# Patient Record
Sex: Male | Born: 1970 | Race: White | Hispanic: No | Marital: Married | Smoking: Former smoker
Health system: Southern US, Academic
[De-identification: ages and names within clinical notes are randomized; demographics above are authoritative.]

## PROBLEM LIST (undated history)

## (undated) DIAGNOSIS — G43909 Migraine, unspecified, not intractable, without status migrainosus: Secondary | ICD-10-CM

## (undated) DIAGNOSIS — R112 Nausea with vomiting, unspecified: Secondary | ICD-10-CM

## (undated) DIAGNOSIS — K839 Disease of biliary tract, unspecified: Secondary | ICD-10-CM

## (undated) DIAGNOSIS — K219 Gastro-esophageal reflux disease without esophagitis: Secondary | ICD-10-CM

## (undated) DIAGNOSIS — Z9889 Other specified postprocedural states: Secondary | ICD-10-CM

## (undated) DIAGNOSIS — K469 Unspecified abdominal hernia without obstruction or gangrene: Secondary | ICD-10-CM

## (undated) DIAGNOSIS — F419 Anxiety disorder, unspecified: Secondary | ICD-10-CM

## (undated) HISTORY — DX: Migraine, unspecified, not intractable, without status migrainosus: G43.909

## (undated) HISTORY — DX: Gastro-esophageal reflux disease without esophagitis: K21.9

## (undated) NOTE — Progress Notes (Signed)
 Formatting of this note might be different from the original.  Assessment & Plan   1. Closed nondisplaced fracture of proximal phalanx of right middle finger, initial encounter (Primary)  2. 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  Assessment of injury: closed nondisplaced fx of proximal phalanx of right middle finger  To help this patient improve, their care plan will be: referral to ortho  Work Status: May return to full duty with no restrictions on 10/06/2023.  Work Restrictions: No restrictions.  Follow Up: Follow up with specialist - referral placed during visit.  Patient understands that they can return here any time or be seen in the Emergency Department if the injury/symptoms worsen in the mean time.    Subjective   Troy Wallace is a 9 y.o. (DOB [birth year]) male.     HPI  WORKERS' COMP  Chief Complaint:  pain in right 3rd finger    Essential Information:  Date of Injury: 08/29/2023  Mechanism of Injury: jammed finger into a door  Injured area fully functional prior to injury/no prior injuries?  Yes   Seen elsewhere for this injury prior to evaluation at [facility]? No  Other Pertinent History:    Pt states that the first 2 days after injury finger was swollen and bruised. Has been slowly improving. Reports he only has pain w/ bending. Was advised by employer to get checked. Able to do his job normally.     Review of Systems:  All other systems are reviewed and are negative    Objective     Physical Exam:  BP 104/67 (BP Location: Right arm, Patient Position: Sitting, BP Cuff Size: Large adult)   Pulse 77   Temp 36.8 C (98.2 F) (Oral)   Resp 18   Ht 1.88 m (6' 2)   Wt 75.3 kg (166 lb)   SpO2 98%   BMI 21.31 kg/m   Vital signs reviewed.  General: Alert, no acute distress  Resp: No signs of respiratory distress.  Musculoskeletal: no overlying skin changes  such as erythema, ecchymosis, abrasions, lacerations. Nontender. Mildly limited ROM of PIP joint. Mild edema around PIP joint noted.   Neuro: Alert, grossly oriented. No obvious abnormalities. No signs of motor weakness or ataxia.    3 xray views of R 3rd finger reviewed - old/healing fx of distal aspect of proximal phalanx of R 3rd finger.     Electronically signed by Damien Comer Conine, PA-C at 10/05/2023  4:39 PM EDT

---

## 1995-07-29 ENCOUNTER — Emergency Department (HOSPITAL_COMMUNITY): Payer: Self-pay

## 2014-07-21 HISTORY — PX: HX HERNIA REPAIR: SHX51

## 2020-05-09 ENCOUNTER — Other Ambulatory Visit: Payer: Self-pay

## 2020-05-09 ENCOUNTER — Ambulatory Visit: Payer: PPO | Attending: FAMILY PRACTICE

## 2020-05-09 DIAGNOSIS — F458 Other somatoform disorders: Secondary | ICD-10-CM | POA: Insufficient documentation

## 2020-05-09 DIAGNOSIS — F419 Anxiety disorder, unspecified: Secondary | ICD-10-CM | POA: Insufficient documentation

## 2020-05-09 DIAGNOSIS — N400 Enlarged prostate without lower urinary tract symptoms: Secondary | ICD-10-CM | POA: Insufficient documentation

## 2020-05-09 DIAGNOSIS — F341 Dysthymic disorder: Secondary | ICD-10-CM | POA: Insufficient documentation

## 2020-05-09 LAB — CBC WITH DIFF
BASOPHIL #: <0.1 10*3/uL (ref <=0.20)
BASOPHIL %: 1 %
EOSINOPHIL #: 0.17 10*3/uL (ref <=0.50)
EOSINOPHIL %: 3 %
HCT: 48.1 % (ref 38.9–52.0)
HGB: 15.8 g/dL (ref 13.4–17.5)
IMMATURE GRANULOCYTE #: <0.1 10*3/uL (ref <0.10)
IMMATURE GRANULOCYTE %: 0 % (ref 0–1)
LYMPHOCYTE #: 1.49 10*3/uL (ref 1.00–4.80)
LYMPHOCYTE %: 30 %
MCH: 31.2 pg (ref 26.0–32.0)
MCHC: 32.8 g/dL (ref 31.0–35.5)
MCV: 95.1 fL (ref 78.0–100.0)
MONOCYTE #: 0.53 10*3/uL (ref 0.20–1.10)
MONOCYTE %: 11 %
MPV: 10.3 fL (ref 8.7–12.5)
NEUTROPHIL #: 2.71 10*3/uL (ref 1.50–7.70)
NEUTROPHIL %: 55 %
PLATELETS: 252 10*3/uL (ref 150–400)
RBC: 5.06 10*6/uL (ref 4.50–6.10)
RDW-CV: 13.7 % (ref 11.5–15.5)
WBC: 5 10*3/uL (ref 3.7–11.0)

## 2020-05-09 LAB — COMPREHENSIVE METABOLIC PNL, FASTING
ALBUMIN/GLOBULIN RATIO: 1.7 (ref >=1.0)
ALBUMIN: 4.3 g/dL (ref 3.5–5.2)
ALKALINE PHOSPHATASE: 60 U/L (ref 41–133)
ALT (SGPT): 27 U/L (ref 0–55)
ANION GAP: 5 mmol/L — ABNORMAL LOW (ref 6–15)
AST (SGOT): 24 U/L (ref 5–34)
BILIRUBIN TOTAL: 0.6 mg/dL (ref 0.2–1.2)
BUN: 15 mg/dL (ref 7–21)
CALCIUM: 8.8 mg/dL (ref 8.0–10.6)
CHLORIDE: 103 mmol/L (ref 98–107)
CO2 TOTAL: 32 mmol/L (ref 21–32)
CREATININE: 1.27 mg/dL (ref 0.80–1.60)
ESTIMATED GFR: >60 mL/min/{1.73_m2}
GLUCOSE: 90 mg/dL (ref 70–100)
POTASSIUM: 4 mmol/L (ref 3.3–5.1)
PROTEIN TOTAL: 6.9 g/dL (ref 6.4–8.3)
SODIUM: 140 mmol/L (ref 136–146)

## 2020-05-09 LAB — LIPID PANEL
CHOL/HDL RATIO: 4.8
CHOLESTEROL: 182 mg/dL (ref 75–200)
HDL CHOL: 38 mg/dL (ref 27–58)
LDL CALC: 90 mg/dL (ref 0–99)
TRIGLYCERIDES: 269 mg/dL — ABNORMAL HIGH (ref 28–153)
VLDL CALC: 54 mg/dL — ABNORMAL HIGH (ref 0–50)

## 2020-05-09 LAB — VITAMIN B12: VITAMIN B 12: 1169 pg/mL — ABNORMAL HIGH (ref 180–914)

## 2020-05-09 LAB — HEPATIC FUNCTION PANEL
ALBUMIN/GLOBULIN RATIO: 1.7 (ref >=1.0)
ALBUMIN: 4.3 g/dL (ref 3.5–5.2)
ALKALINE PHOSPHATASE: 60 U/L (ref 41–133)
ALT (SGPT): 27 U/L (ref 0–55)
AST (SGOT): 24 U/L (ref 5–34)
BILIRUBIN DIRECT: 0.1 mg/dL (ref 0.0–0.5)
BILIRUBIN TOTAL: 0.6 mg/dL (ref 0.2–1.2)
PROTEIN TOTAL: 6.9 g/dL (ref 6.4–8.3)

## 2020-05-09 LAB — THYROXINE, FREE (FREE T4): THYROXINE (T4), FREE: 0.51 ng/dL — ABNORMAL LOW (ref 0.61–1.12)

## 2020-05-09 LAB — THYROID STIMULATING HORMONE (SENSITIVE TSH): TSH: 11.659 u[IU]/mL — ABNORMAL HIGH (ref 0.200–5.000)

## 2020-05-10 LAB — PSA, DIAGNOSTIC: PSA, TOTAL: 0.56 ng/mL (ref <=4.00)

## 2020-05-10 LAB — VITAMIN D 25 TOTAL: VITAMIN D,25-OH,TOTAL,IA: 35 ng/mL (ref 30–100)

## 2020-06-20 ENCOUNTER — Other Ambulatory Visit: Payer: Self-pay

## 2020-06-20 ENCOUNTER — Encounter (INDEPENDENT_AMBULATORY_CARE_PROVIDER_SITE_OTHER): Payer: Self-pay | Admitting: Surgery

## 2020-06-20 ENCOUNTER — Ambulatory Visit: Payer: PPO | Attending: Surgery | Admitting: Surgery

## 2020-06-20 VITALS — BP 118/80 | HR 87 | Ht 74.0 in | Wt 167.0 lb

## 2020-06-20 DIAGNOSIS — R197 Diarrhea, unspecified: Secondary | ICD-10-CM

## 2020-06-20 DIAGNOSIS — R110 Nausea: Secondary | ICD-10-CM

## 2020-06-20 DIAGNOSIS — R1011 Right upper quadrant pain: Secondary | ICD-10-CM

## 2020-06-20 NOTE — H&P (Signed)
Patient:   Troy Wallace                      Age:   50 y.o.     Sex:  male     DOB:  [birth year]  Associated Diagnoses:  Abdominal pain change in bowel habits  Author:   Newman Pies MD, Apolinar Junes      Chief Complaint   I have been having some abdominal pain and change in bowel habit  HISTORY OF PRESENT ILLNESS:   Troy Wallace is a pleasant 50 y.o. male who was referred to my office by Miguel Aschoff, DO for consultation regarding abdominal pain and change in bowel habits patient has not had any blood but has had persistent nausea abdominal pain and cramping with diarrhea.      Review of Systems   Constitutional:  No fever, No chills, No sweats, No weakness, No fatigue, No decreased activity.    Eye:  No recent visual problem, No icterus, No double vision, No visual disturbances.    Ear/Nose/Mouth/Throat:  No decreased hearing, No ear pain, No nasal congestion, No sore throat.    Respiratory:  No shortness of breath, No sputum production, No hemoptysis, No wheezing, No cyanosis, No apnea.    Cardiovascular:  No chest pain, No palpitations, No bradycardia, No tachycardia.    Gastrointestinal:  No nausea, No vomiting, No diarrhea, No constipation, No heartburn, No abdominal pain, No hematemesis.    Genitourinary:  No dysuria, No hematuria, No change in urine stream, No urethral discharge, No lesions.    Hematology/Lymphatics:  No bruising tendency, No bleeding tendency, No swollen lymph glands.    Endocrine:  No excessive thirst, No polyuria, No cold intolerance, No heat intolerance, No excessive hunger.    Immunologic:  Negative.    Musculoskeletal:  Negative.    Integumentary:  No rash, No pruritus, No petechiae.    Neurologic:  Alert and oriented X4, No confusion, No numbness, No tingling, No headache.    Psychiatric:  Negative.    All other systems reviewed and negative      Health Status   Allergies:   No Known Allergies    Medications:    Outpatient Encounter Medications as of 06/20/2020    Medication Sig Dispense Refill    buPROPion (WELLBUTRIN XL) 300 mg extended release 24 hr tablet Take 300 mg by mouth Once a day      lamoTRIgine (LAMICTAL) 25 mg Oral Tablet Take 25 mg by mouth Twice daily      nortriptyline (PAMELOR) 25 mg Oral Capsule TAKE ONE CAPSULE BY MOUTH IN THE EVENING      QUEtiapine (SEROQUEL) 100 mg Oral Tablet TAKE 1/2 TO 1 TABLET BY MOUTH AT BEDTIME AS NEEDED for insomnia      QUEtiapine (SEROQUEL) 200 mg Oral Tablet Take 200 mg by mouth Every night      rizatriptan (MAXALT) 10 mg Oral Tablet take 1 tablet by mouth as directed as needed       No facility-administered encounter medications on file as of 06/20/2020.       Problem list:    There is no problem list on file for this patient.        Histories   Past Medical History:    Past Medical History:   Diagnosis Date    Esophageal reflux     Migraines            Family History:    Family Medical  History:     Problem Relation (Age of Onset)    Colon Polyps Father, Brother    Diabetes Father    Heart Disease Father            Past Surgical history:    Past Surgical History:   Procedure Laterality Date    HX HERNIA REPAIR             Social History:       Social History     Socioeconomic History    Marital status: Married   Tobacco Use    Smoking status: Never Smoker    Smokeless tobacco: Current Market researcher Use: Never used   Substance and Sexual Activity    Alcohol use: Never    Drug use: Never           Physical Examination   VS/Measurements   BP 118/80    Pulse 87    Ht 1.88 m (6\' 2" )    Wt 75.8 kg (167 lb)    BMI 21.44 kg/m         General:  Alert and oriented, No acute distress.    Eye:  Vision unchanged.    HENT:  Normocephalic.    Neck:  Supple, Non-tender, No jugular venous distention, No lymphadenopathy, No thyromegaly.    Respiratory:  Lungs are clear to auscultation, Breath sounds are equal, Symmetrical chest wall expansion, No chest wall tenderness.    Cardiovascular:  Normal rate, Regular  rhythm, No murmur, No gallop, Good pulses equal in all extremities, Normal peripheral perfusion.    Gastrointestinal:  Soft, Non-tender, Non-distended, No organomegaly, Bowel sounds.   Genitourinary:  No costovertebral angle tenderness.    Lymphatics:  No lymphadenopathy neck, axilla, groin.    Musculoskeletal:  Normal range of motion.    Integumentary:  Warm, Dry.    Neurologic:  Alert, Oriented, Normal sensory, Normal motor function.    Psychiatric:  Cooperative.       Labs and Imaging:  Pending    Assessment:  Agree with need for upper and lower endoscopy as well as gallbladder ultrasound and HIDA scan       Plan:  We will plan upper and lower endoscopy risks include bleeding infection perforation failure to diagnose he understands wishes to proceed will also order gallbladder ultrasound and HIDA scan

## 2020-06-20 NOTE — Nursing Note (Signed)
Pt Presents here today for EVAL FOR EGD/ COLONOSCOPY

## 2020-06-27 ENCOUNTER — Other Ambulatory Visit: Payer: Self-pay

## 2020-06-27 ENCOUNTER — Ambulatory Visit
Admission: RE | Admit: 2020-06-27 | Discharge: 2020-06-27 | Disposition: A | Discharge status: Home / Self Care | Payer: PPO | Source: Ambulatory Visit | Attending: Surgery | Admitting: Surgery

## 2020-06-27 DIAGNOSIS — R1011 Right upper quadrant pain: Secondary | ICD-10-CM

## 2020-07-23 ENCOUNTER — Telehealth (INDEPENDENT_AMBULATORY_CARE_PROVIDER_SITE_OTHER): Payer: Self-pay | Admitting: Surgery

## 2020-07-23 NOTE — Telephone Encounter (Signed)
Health Plan Insurance  Spoke to [facility] @ 10:18am    NO Authorization is required for surgery scheduled Aug 21, 2020    Procedure codes:  43235 and 23343    Call Ref # [id number]

## 2020-08-01 ENCOUNTER — Ambulatory Visit
Admission: RE | Admit: 2020-08-01 | Discharge: 2020-08-01 | Disposition: A | Discharge status: Home / Self Care | Payer: PPO | Source: Ambulatory Visit | Attending: Surgery | Admitting: Surgery

## 2020-08-01 ENCOUNTER — Other Ambulatory Visit: Payer: Self-pay

## 2020-08-01 DIAGNOSIS — R1011 Right upper quadrant pain: Secondary | ICD-10-CM | POA: Insufficient documentation

## 2020-08-01 MED ORDER — SINCALIDE 5 MCG SOLUTION FOR INJECTION
0.0200 ug/kg | INTRAMUSCULAR | Status: DC
Start: 2020-08-01 — End: 2020-08-02

## 2020-08-04 ENCOUNTER — Encounter (HOSPITAL_COMMUNITY): Payer: Self-pay | Admitting: Radiology

## 2020-08-08 ENCOUNTER — Encounter (HOSPITAL_COMMUNITY): Payer: Self-pay | Admitting: Surgery

## 2020-08-08 ENCOUNTER — Ambulatory Visit: Payer: PPO | Attending: INTERNAL MEDICINE

## 2020-08-08 ENCOUNTER — Other Ambulatory Visit: Payer: Self-pay

## 2020-08-08 ENCOUNTER — Telehealth (INDEPENDENT_AMBULATORY_CARE_PROVIDER_SITE_OTHER): Payer: Self-pay | Admitting: Physician Assistant

## 2020-08-08 DIAGNOSIS — Z136 Encounter for screening for cardiovascular disorders: Secondary | ICD-10-CM

## 2020-08-08 DIAGNOSIS — E039 Hypothyroidism, unspecified: Secondary | ICD-10-CM | POA: Insufficient documentation

## 2020-08-08 DIAGNOSIS — I519 Heart disease, unspecified: Secondary | ICD-10-CM | POA: Insufficient documentation

## 2020-08-08 LAB — THYROXINE, FREE (FREE T4): THYROXINE (T4), FREE: 0.48 ng/dL — ABNORMAL LOW (ref 0.61–1.12)

## 2020-08-08 LAB — LIPID PANEL
CHOL/HDL RATIO: 5.7
CHOLESTEROL: 212 mg/dL — ABNORMAL HIGH (ref 75–200)
HDL CHOL: 37 mg/dL (ref 27–58)
LDL CALC: 124 mg/dL — ABNORMAL HIGH (ref 0–99)
TRIGLYCERIDES: 257 mg/dL — ABNORMAL HIGH (ref 28–153)
VLDL CALC: 51 mg/dL — ABNORMAL HIGH (ref 0–50)

## 2020-08-08 LAB — THYROID STIMULATING HORMONE (SENSITIVE TSH): TSH: 8.984 u[IU]/mL — ABNORMAL HIGH (ref 0.450–5.330)

## 2020-08-08 MED ORDER — SUPREP BOWEL PREP KIT 17.5 GRAM-3.13 GRAM-1.6 GRAM ORAL SOLUTION
ORAL | 0 refills | Status: DC
Start: 2020-08-08 — End: 2020-09-10

## 2020-08-08 NOTE — Telephone Encounter (Signed)
Patient request suprep bowel prep kit. Sent to pharmacy [facility])

## 2020-08-14 ENCOUNTER — Ambulatory Visit (HOSPITAL_BASED_OUTPATIENT_CLINIC_OR_DEPARTMENT_OTHER): Payer: PPO

## 2020-08-14 ENCOUNTER — Other Ambulatory Visit: Payer: Self-pay

## 2020-08-14 ENCOUNTER — Ambulatory Visit
Admission: RE | Admit: 2020-08-14 | Discharge: 2020-08-14 | Disposition: A | Discharge status: Home / Self Care | Payer: PPO | Source: Ambulatory Visit | Attending: Surgery | Admitting: Surgery

## 2020-08-14 ENCOUNTER — Encounter (HOSPITAL_COMMUNITY): Payer: Self-pay | Admitting: Surgery

## 2020-08-14 ENCOUNTER — Encounter (HOSPITAL_COMMUNITY)
Admission: RE | Disposition: A | Discharge status: Home / Self Care | Payer: Self-pay | Source: Ambulatory Visit | Attending: Surgery

## 2020-08-14 ENCOUNTER — Ambulatory Visit (HOSPITAL_COMMUNITY): Payer: PPO

## 2020-08-14 DIAGNOSIS — R1084 Generalized abdominal pain: Secondary | ICD-10-CM

## 2020-08-14 DIAGNOSIS — R1013 Epigastric pain: Secondary | ICD-10-CM

## 2020-08-14 HISTORY — DX: Unspecified abdominal hernia without obstruction or gangrene: K46.9

## 2020-08-14 HISTORY — DX: Nausea with vomiting, unspecified: R11.2

## 2020-08-14 HISTORY — DX: Anxiety disorder, unspecified: F41.9

## 2020-08-14 HISTORY — DX: Other specified postprocedural states: Z98.890

## 2020-08-14 SURGERY — GASTROSCOPY WITH BIOPSY
Anesthesia: Monitor Anesthesia Care | Wound class: Clean Contaminated Wounds-The respiratory, GI, Genital, or urinary

## 2020-08-14 MED ORDER — PROPOFOL 10 MG/ML IV BOLUS
INJECTION | Freq: Once | INTRAVENOUS | Status: DC | PRN
Start: 2020-08-14 — End: 2020-08-14
  Administered 2020-08-14: 30 mg via INTRAVENOUS
  Administered 2020-08-14: 20 mg via INTRAVENOUS
  Administered 2020-08-14: 50 mg via INTRAVENOUS

## 2020-08-14 MED ORDER — ONDANSETRON HCL (PF) 4 MG/2 ML INJECTION SOLUTION
Freq: Once | INTRAMUSCULAR | Status: DC | PRN
Start: 2020-08-14 — End: 2020-08-14
  Administered 2020-08-14: 4 mg via INTRAVENOUS

## 2020-08-14 MED ORDER — LACTATED RINGERS INTRAVENOUS SOLUTION
INTRAVENOUS | Status: DC
Start: 2020-08-14 — End: 2020-08-14

## 2020-08-14 MED ORDER — MIDAZOLAM (PF) 1 MG/ML INJECTION SOLUTION
Freq: Once | INTRAMUSCULAR | Status: DC | PRN
Start: 2020-08-14 — End: 2020-08-14
  Administered 2020-08-14: 2 mg via INTRAVENOUS

## 2020-08-14 MED ORDER — FENTANYL (PF) 50 MCG/ML INJECTION SOLUTION
Freq: Once | INTRAMUSCULAR | Status: DC | PRN
Start: 2020-08-14 — End: 2020-08-14
  Administered 2020-08-14 (×2): 100 ug via INTRAVENOUS

## 2020-08-14 MED ORDER — LIDOCAINE (PF) 100 MG/5 ML (2 %) INTRAVENOUS SYRINGE
INJECTION | Freq: Once | INTRAVENOUS | Status: DC | PRN
Start: 2020-08-14 — End: 2020-08-14
  Administered 2020-08-14: 50 mg via INTRAVENOUS

## 2020-08-14 MED ORDER — SIMETHICONE 40 MG/0.6 ML ORAL DROPS,SUSPENSION
Freq: Once | ORAL | Status: DC | PRN
Start: 2020-08-14 — End: 2020-08-14
  Administered 2020-08-14: 40 mg

## 2020-08-14 SURGICAL SUPPLY — 30 items
BLOCK BITE OD60 ENDO BITE BLOCK 60 FRENCH W/DENTAL (ENDOSCOPIC SUPPLIES) ×1 IMPLANT
BLOCK BITE OD60 ENDO BITE BLOCK 60 FRENCH W/DENTAL (INSTRUMENTS ENDOMECHANICAL) ×1
CANISTER 2000 SUCT BLUE TOP FLUSHABLE (SUPP) ×4 IMPLANT
CLIP LGT RSL 360 ULTRA 235CM B RD ROT CONTROL KNOB 17MM OPN (ENDOSCOPIC SUPPLIES) IMPLANT
CLIP LGT RSL 360 ULTRA 235CM B RD ROT CONTROL KNOB 17MM OPN (INSTRUMENTS ENDOMECHANICAL)
CLOTEST 60480 BILL LAB/DEL DHC KIT MICROBIOLOGY TEST CLOTEST (SUPP) IMPLANT
ELECTRODE GROUNDING PAD 9165 PAD ELECTROSURGICAL GROUNDING (SUPP) IMPLANT
FORCEP BIOPSY EXL COLD 513412 2MM RJ 4 2.8MM STD CPC STRL (MED) IMPLANT
FORCEP BIOPSY HOT 515033 40/BX 0CM 2.8MM 2.2MM RJ 4 CUP DISP (SUPP) ×2 IMPLANT
GLOVE EXAM LG NITRIL PF LAVEN GLOVE EXAM LG NITRIL PF LAVEN (GLOVES AND ACCESSORIES) ×2 IMPLANT
KIT 00719701 COMPLIANCE KIT 00719701 COMPLIANCE (SUPP) ×4 IMPLANT
LIGATOR 100225 HEMORRHOID LIGATOR 100225 HEMORRHOID (SURG) IMPLANT
NEEDLE 00711807 INJ ARTICULATO NEEDLE 00711807 INJ ARTICULAT (SUPP) IMPLANT
PROBE 6007 GOLD 7 PROBE 6007 GOLD 7 (SUPP) IMPLANT
PROBE 6010 GOLD 10 PROBE 6010 GOLD 10 (SUPP) IMPLANT
PROBE 60220 GOLD INACT 11/21 FOR WORKDAY (SUPP) IMPLANT
PROBE M0056007 GOLD PROBE M0056007 GOLD (SUPP) IMPLANT
SENSATION SNARE 626940 INACT 11/21 FOR WORKDAY (SUPP) IMPLANT
SNARE 6237 CAPTIV CRESCENT MED SNARE 6237 CAPTIV CRESCENT ME (SUPP) IMPLANT
SNARE 6247 X-SML CAPITIFLEX SNARE 6247 X-SML CAPITIFLEX (SUPP) IMPLANT
SNARE CS3-11023230 COLD SNARE CS3-11023230 COLD (SUPP) IMPLANT
SNARE KIT 25 X 120 ITINOL CATH PREFORM LOOP (SUPP) IMPLANT
SPEEDBAND 422502 SUPERVIE SPEEDBAND 422502 SUPERVIE (ENDOSCOPIC SUPPLIES) IMPLANT
SPEEDBAND 422502 SUPERVIE SPEEDBAND 422502 SUPERVIE (INSTRUMENTS ENDOMECHANICAL)
SYRINGE 309654 SLIP TIP 60CC YRG DEHP-FR PVC FREE MED DISP (SUPP) ×2 IMPLANT
SYRINGE GIS45 TATTOO MARKER ENDOSCOPIC SPOT EX PREF (SUPP) IMPLANT
TRAP POLYP QUAD CHAMBER TRAP POLYP QUAD CHAMBER (SURG) IMPLANT
TUBE M00568211 FEEDING PEG 20F TUBE M00568211 FEEDING PEG 20 (TUBE/TUBING & SUCTION SUPPLIES) IMPLANT
TUBING SUCTION CONNECT10FT TUBING SUCTION CONNECT 10ft (SUPP) ×4 IMPLANT
UNDERPADS 23X36 DRI-SORB YPROP MDRT ABS NWVN LF DISP (MED) ×4 IMPLANT

## 2020-08-14 NOTE — Discharge Instructions (Signed)
SURGICAL DISCHARGE INSTRUCTIONS     Dr. Ball, Brandon, MD  performed your EGD, COLONOSCOPY today at the [facility]    [facility] ([facility])  Monday through Friday 8:00 am to 4:00 pm  [phone]      PLEASE SEE WRITTEN HANDOUTS AS DISCUSSED BY YOUR NURSE:      SIGNS AND SYMPTOMS OF A WOUND / INCISION INFECTION   Be sure to watch for the following:   Increase in redness or red streaks near or around the wound or incision.   Increase in pain that is intense or severe and cannot be relieved by the pain medication that your doctor has given you.   Increase in swelling that cannot be relieved by elevation of a body part, or by applying ice, if permitted.   Increase in drainage, or if yellow / green in color and smells bad. This could be on a dressing or a cast.   Increase in fever for longer than 24 hours, or an increase that is higher than 101 degrees Fahrenheit (normal body temperature is 98 degrees Fahrenheit). The incision may feel warm to the touch.    **CALL YOUR DOCTOR IF ONE OR MORE OF THESE SIGNS / SYMPTOMS SHOULD OCCUR.    ANESTHESIA INFORMATION   ANESTHESIA -- ADULT PATIENTS:  You have received intravenous sedation / general anesthesia, and you may feel drowsy and light-headed for several hours. You may even experience some forgetfulness of the procedure. DO NOT DRIVE A MOTOR VEHICLE or perform any activity requiring complete alertness or coordination until you feel fully awake in about 24-48 hours. Do not drink alcoholic beverages for at least 24 hours. Do not stay alone, you must have a responsible adult available to be with you. You may also experience a dry mouth or nausea for 24 hours. This is a normal side effect and will disappear as the effects of the medication wear off.    REMEMBER   If you experience any difficulty breathing, chest pain, bleeding that you feel is excessive, persistent nausea or vomiting or for any other  concerns:  Call your physician Dr.  Ball, Brandon, MD or [phone] You may also ask to have the general doctor on call paged. They are available to you 24 hours a day.    SPECIAL INSTRUCTIONS / COMMENTS       FOLLOW-UP APPOINTMENTS   Please call your surgeon's office at the number listed about  to schedule a date / time of return.

## 2020-08-14 NOTE — Anesthesia Postprocedure Evaluation (Signed)
Anesthesia Post Op Evaluation    Patient: Troy Wallace  Procedure(s):  EGD W/ BIOPSY  COLONOSCOPY    Last Vitals:Temperature: 36.1 C (97 F) (08/14/20 0911)  Heart Rate: 78 (08/14/20 0911)  BP (Non-Invasive): 118/66 (08/14/20 0932)  Respiratory Rate: 16 (08/14/20 0932)  SpO2: 98 % (08/14/20 0932)    No complications documented.    Patient is sufficiently recovered from the effects of anesthesia to participate in the evaluation and has returned to their pre-procedure level.  Patient location during evaluation: PACU       Patient participation: complete - patient participated  Level of consciousness: awake and alert and responsive to verbal stimuli    Pain score: 0  Pain management: adequate  Airway patency: patent    Anesthetic complications: no  Cardiovascular status: acceptable  Respiratory status: acceptable  Hydration status: acceptable  Patient post-procedure temperature: Pt Normothermic   PONV Status: Absent

## 2020-08-14 NOTE — Anesthesia Transfer of Care (Signed)
ANESTHESIA TRANSFER OF CARE   Troy Wallace is a 50 y.o. ,male, Weight: 72.6 kg (160 lb)   had Procedure(s):  EGD W/ BIOPSY  COLONOSCOPY  performed  08/14/20   Primary Service: Lizbeth Bark, MD    Past Medical History:   Diagnosis Date   . Abdominal hernia    . Anxiety    . Esophageal reflux    . Migraines    . PONV (postoperative nausea and vomiting)       Allergy History as of 08/14/20      No Known Allergies              I completed my transfer of care / handoff to the receiving personnel during which we discussed:  Access, Airway, All key/critical aspects of case discussed, Analgesia, Antibiotics, Expectation of post procedure, Fluids/Product, Gave opportunity for questions and acknowledgement of understanding, Labs and PMHx                                                                      Last OR Temp: Temperature: 36.1 C (97 F)  ABG:  POTASSIUM   Date Value Ref Range Status   05/09/2020 4.0 3.3 - 5.1 mmol/L Final     CALCIUM   Date Value Ref Range Status   05/09/2020 8.8 8.0 - 10.6 mg/dL Final     Airway:* No LDAs found *  Blood pressure 122/71, pulse 78, temperature 36.1 C (97 F), resp. rate 12, height 1.88 m (6\' 2" ), weight 72.6 kg (160 lb), SpO2 97 %.

## 2020-08-14 NOTE — Anesthesia Preprocedure Evaluation (Signed)
ANESTHESIA PRE-OP EVALUATION  Planned Procedure: EGD (N/A )  COLONOSCOPY (N/A )  Review of Systems    PONV       patient summary reviewed  nursing notes reviewed        Pulmonary  negative pulmonary ROS,    Cardiovascular  negative cardio ROS,   ECG reviewed ,No peripheral edema,        GI/Hepatic/Renal    GERD     Endo/Other   neg endo/other ROS,        Neuro/Psych/MS    headaches and anxiety     Cancer  negative hematology/oncology ROS,                    Physical Assessment      Airway       Mallampati: I    TM distance: >3 FB    Neck ROM: full  Mouth Opening: good.  Facial hair  Beard  No endotracheal tube present  No Tracheostomy present    Dental       Dentition intact             Pulmonary    Breath sounds clear to auscultation  (-) no rhonchi, no decreased breath sounds, no wheezes, no rales and no stridor     Cardiovascular    Rhythm: regular  Rate: Normal  (-) no friction rub, carotid bruit is not present, no peripheral edema and no murmur     Other findings            Plan  ASA 2     Planned anesthesia type: MAC                     Intravenous induction     Anesthesia issues/risks discussed are: Dental Injuries, PONV, Stroke and Blood Loss.  Anesthetic plan and risks discussed with patient.          Patient's NPO status is appropriate for Anesthesia.           (Pt will need Zofran for PONV.)

## 2020-08-14 NOTE — H&P (Signed)
H&P  Lizbeth Bark, MD (Physician) . [facility]  Expand AllCollapse All    Patient:   Troy Wallace                      Age:   50 y.o.     Sex:  male     DOB:  04/27/1970  Associated Diagnoses:  Abdominal pain change in bowel habits  Author:   Newman Pies MD, Apolinar Junes      Chief Complaint   I have been having some abdominal pain and change in bowel habit  HISTORY OF PRESENT ILLNESS:   Troy Wallace is a pleasant 50 y.o. male who was referred to my office by Miguel Aschoff, DO for consultation regarding abdominal pain and change in bowel habits patient has not had any blood but has had persistent nausea abdominal pain and cramping with diarrhea.      Review of Systems   Constitutional:  No fever, No chills, No sweats, No weakness, No fatigue, No decreased activity.    Eye:  No recent visual problem, No icterus, No double vision, No visual disturbances.    Ear/Nose/Mouth/Throat:  No decreased hearing, No ear pain, No nasal congestion, No sore throat.    Respiratory:  No shortness of breath, No sputum production, No hemoptysis, No wheezing, No cyanosis, No apnea.    Cardiovascular:  No chest pain, No palpitations, No bradycardia, No tachycardia.    Gastrointestinal:  No nausea, No vomiting, No diarrhea, No constipation, No heartburn, No abdominal pain, No hematemesis.    Genitourinary:  No dysuria, No hematuria, No change in urine stream, No urethral discharge, No lesions.    Hematology/Lymphatics:  No bruising tendency, No bleeding tendency, No swollen lymph glands.    Endocrine:  No excessive thirst, No polyuria, No cold intolerance, No heat intolerance, No excessive hunger.    Immunologic:  Negative.    Musculoskeletal:  Negative.    Integumentary:  No rash, No pruritus, No petechiae.    Neurologic:  Alert and oriented X4, No confusion, No numbness, No tingling, No headache.    Psychiatric:  Negative.    All other systems reviewed and negative      Health Status   Allergies:   No  Known Allergies    Medications:           Outpatient Encounter Medications as of 06/20/2020   Medication Sig Dispense Refill   . buPROPion (WELLBUTRIN XL) 300 mg extended release 24 hr tablet Take 300 mg by mouth Once a day     . lamoTRIgine (LAMICTAL) 25 mg Oral Tablet Take 25 mg by mouth Twice daily     . nortriptyline (PAMELOR) 25 mg Oral Capsule TAKE ONE CAPSULE BY MOUTH IN THE EVENING     . QUEtiapine (SEROQUEL) 100 mg Oral Tablet TAKE 1/2 TO 1 TABLET BY MOUTH AT BEDTIME AS NEEDED for insomnia     . QUEtiapine (SEROQUEL) 200 mg Oral Tablet Take 200 mg by mouth Every night     . rizatriptan (MAXALT) 10 mg Oral Tablet take 1 tablet by mouth as directed as needed       No facility-administered encounter medications on file as of 06/20/2020.       Problem list:    There is no problem list on file for this patient.        Histories   Past Medical History:         Past Medical History:   Diagnosis  Date   . Esophageal reflux    . Migraines            Family History:         Family Medical History:        Problem Relation (Age of Onset)    Colon Polyps Father, Brother    Diabetes Father    Heart Disease Father               Past Surgical history:          Past Surgical History:   Procedure Laterality Date   . HX HERNIA REPAIR             Social History:           Social History          Socioeconomic History   . Marital status: Married   Tobacco Use   . Smoking status: Never Smoker   . Smokeless tobacco: Current User   Vaping Use   . Vaping Use: Never used   Substance and Sexual Activity   . Alcohol use: Never   . Drug use: Never           Physical Examination   VS/Measurements   BP 118/80   Pulse 87   Ht 1.88 m (6\' 2" )   Wt 75.8 kg (167 lb)   BMI 21.44 kg/m         General:  Alert and oriented, No acute distress.    Eye:  Vision unchanged.    HENT:  Normocephalic.    Neck:  Supple, Non-tender, No jugular venous distention, No lymphadenopathy, No thyromegaly.    Respiratory:   Lungs are clear to auscultation, Breath sounds are equal, Symmetrical chest wall expansion, No chest wall tenderness.    Cardiovascular:  Normal rate, Regular rhythm, No murmur, No gallop, Good pulses equal in all extremities, Normal peripheral perfusion.    Gastrointestinal:  Soft, Non-tender, Non-distended, No organomegaly, Bowel sounds.   Genitourinary:  No costovertebral angle tenderness.    Lymphatics:  No lymphadenopathy neck, axilla, groin.    Musculoskeletal:  Normal range of motion.    Integumentary:  Warm, Dry.    Neurologic:  Alert, Oriented, Normal sensory, Normal motor function.    Psychiatric:  Cooperative.       Labs and Imaging:  Pending    Assessment:  Agree with need for upper and lower endoscopy as well as gallbladder ultrasound and HIDA scan       Plan:  We will plan upper and lower endoscopy risks include bleeding infection perforation failure to diagnose he understands wishes to proceed will also order gallbladder ultrasound and HIDA scan            Other Notes  All notes     Nursing Note from      Instructions     After Visit Summary (Automatic SnapShot taken 06/20/2020)        Additional Documentation    Vitals:  BP 118/80     Pulse 87     Ht 1.88 m (6\' 2" )     Wt 75.8 kg (167 lb)     BMI 21.44 kg/m     BSA 1.99 m

## 2020-08-15 LAB — HELICOBACTER PYLORI RAPID UREASE: H PYLORI PATIENT RESULT: NEGATIVE

## 2020-08-29 ENCOUNTER — Encounter (INDEPENDENT_AMBULATORY_CARE_PROVIDER_SITE_OTHER): Payer: Self-pay | Admitting: Physician Assistant

## 2020-08-29 ENCOUNTER — Other Ambulatory Visit: Payer: Self-pay

## 2020-08-29 ENCOUNTER — Ambulatory Visit: Payer: PPO | Attending: Physician Assistant | Admitting: Physician Assistant

## 2020-08-29 VITALS — BP 109/74 | HR 92 | Ht 74.0 in | Wt 170.0 lb

## 2020-08-29 DIAGNOSIS — K828 Other specified diseases of gallbladder: Secondary | ICD-10-CM

## 2020-08-29 NOTE — H&P (View-Only) (Signed)
[place] [facility]  Clinic History & Physical    Patient: Troy Wallace   D.O.B.: 1970-05-01  MRN# [id number]  Date of Service: 08/29/2020    Referring Provider: No ref. provider found    PCP: Nunzio Cory, DO    Chief Complaint:   Chief Complaint   Patient presents with   . Follow Up     FOLLOW UP ON COLONOSCOPY AND EGD 5/26         HPI  Troy Wallace is a 50 y.o. male who presents today for follow up after testing and after colonoscopy and EGD. He was initially seen by Dr. Diona Foley in April with cramping abdominal pain, nausea and change in bowel habits. He underwent RUQ [place], HIDA scan and upper and lower endoscopy. His endoscopic evaluation was relatively normal. RUQ without stones. HIDA scan with 5% EF. Patient still has ongoing nausea. He complains of epigastric abdominal pain. Bowel function has improved. He would like to proceed with cholecystectomy.     Allergies:  No Known Allergies    Current Outpatient Prescriptions  Outpatient Medications Marked as Taking for the 08/29/20 encounter (Office Visit) with Peggyann Juba, PA-C   Medication Sig   . atorvastatin (LIPITOR) 10 mg Oral Tablet Take 10 mg by mouth Every night   . buPROPion (WELLBUTRIN XL) 300 mg extended release 24 hr tablet Take 300 mg by mouth Once a day   . COCONUT OIL ORAL Take 1 Capsule by mouth Twice daily   . lamoTRIgine (LAMICTAL) 25 mg Oral Tablet Take 25 mg by mouth Twice daily 125 mg twice a day   . nortriptyline (PAMELOR) 25 mg Oral Capsule TAKE ONE CAPSULE BY MOUTH IN THE EVENING   . omega-3-DHA-EPA-fish oil 1,000 mg (120 mg-180 mg) Oral Capsule Take 1,000 mg by mouth Once a day   . pediatric multivitamins Oral Tablet, Chewable Chew 1 Tablet Once a day   . QUEtiapine (SEROQUEL) 100 mg Oral Tablet TAKE 1/2 TO 1 TABLET BY MOUTH AT BEDTIME AS NEEDED for insomnia   . QUEtiapine (SEROQUEL) 200 mg Oral Tablet Take 200 mg by mouth Every night   . rizatriptan (MAXALT) 10 mg Oral Tablet take 1 tablet by  mouth as directed as needed   . sodium,potassium,mag sulfates (SUPREP BOWEL PREP KIT) 17.5-3.13-1.6 gram Oral Recon Soln Pour 6oz bottle of SUPREP into container,add cool water to 16oz line.Mix.Drink ALL the liquid.Then drink 32oz of water over the next hour.        Past Medical History:    Past Medical History:   Diagnosis Date   . Abdominal hernia    . Anxiety    . Esophageal reflux    . Migraines    . PONV (postoperative nausea and vomiting)            Past Surgical History:    Past Surgical History:   Procedure Laterality Date   . HX HAND SURGERY     . HX HERNIA REPAIR Bilateral 07/2014    laparoscopic bilateral inguinal hernia repair with Dr. Diona Foley   . SUBDURAL HEMATOMA EVACUATION VIA CRANIOTOMY             Family History:  Family Medical History:     Problem Relation (Age of Onset)    Colon Polyps Father, Brother    Diabetes Father    Heart Disease Father            Social History:    Social History  Socioeconomic History   . Marital status: Married   Tobacco Use   . Smoking status: Never Smoker   . Smokeless tobacco: Current User   Vaping Use   . Vaping Use: Never used   Substance and Sexual Activity   . Alcohol use: Never   . Drug use: Never       ROS:  General: Denies fever, chills, or any changes in weight.  EENT: Denies blurry vision, tinnitus, nasal congestion, or trouble with swallowing.  Cardiovascular: Denies chest pain, pain on exertion, or palpitations.  Respiratory: Denies SOB, cough, or asthma/COPD/emphysema.  Gastrointestinal: +epigastic abdominal pain, +N,  Denies any change in bowel movements or blood in stool.  GU: Denies dysuria, change in urinary habits, or hematuria.  Musculoskeletal: Denies weakness or joint pain. Denies trouble moving extremities.  Neurological: Denies HA, dizziness, hx of stroke.  Hematologic: Denies hx of bleeding disorders or blood clots.  Endocrine: Denies heat/cold intolerance, flushing, or change in glove/shoe size.  Psychiatric: Denies changes in mood, anxiety,  or depression.    Objective:  BP 109/74   Pulse 92   Ht 1.88 m (_0 )   Wt 77.1 kg (170 lb)   BMI 21.83 kg/m       Physical Exam:  Constitutional:AAOx3, WDWN, NAD  HEENT: Normocephalic, atraumatic. PERRLA.   Neck: Trachea midline, supple  Cardiovascular: RR  Pulmonary: Lungs CTA bilaterally; No wheezes, rales, or rhonchi observed. Normal respiratory effort. No retractions.  Abdomen: Abdomen soft and supple; No tenderness. Non-distended; No masses or HSM present. Bowel sounds normal and active in all 4 quadrants.  Extremities: No peripheral edema  Musculoskeletal: Normal muscle strength and tone of all four extremities.  Skin: No rashes or lesions present; Warm and dry. No jaundice.  Psychiatric: Normal mood and affect; Judgement and thought content normal.    Data:  Labs:  Pre-Op Ordered    EKG:  Pre-OP Ordered    Endoscopic studies:    EGD and Colonoscopy completed on 5/26: normal evaluations. EGD negative for H. Pylori       Imaging studies:  I have reviewed all available imagining studies:     06/27/20: RUQ [place]: normal evaluation   08/01/20: HIDA scan: 5% EF     CXR: Pre-Op Ordered     Assessment:  1. Biliary dyskinesia         Plan:  1. Will proceed with laparoscopic cholecystectomy. Risks including bleeding, infection, injury to the bile duct, persistent symptoms were reviewed with the patient. He is in agreement to proceed.     He will return to clinic post-operatively. He was given the opportunity to ask questions, and those questions were appropriately answered. He agreed with the treatment plan and is encouraged to call with any additional questions or concerns.     Patient was seen independently. Dr. Diona Foley was available via telephone for consultation.     I independently of the faculty provider spent a total of (31) minutes in direct/indirect care of this patient including initial evaluation, review of laboratory, radiology, diagnostic studies, review of medical record, order entry and coordination of  care.    Peggyann Juba, PA-C  08/29/2020, 13:23

## 2020-08-29 NOTE — H&P (Signed)
[place] [facility]  Clinic History & Physical    Patient: Troy Wallace   D.O.B.: 1970-05-01  MRN# [id number]  Date of Service: 08/29/2020    Referring Provider: No ref. provider found    PCP: Nunzio Cory, DO    Chief Complaint:   Chief Complaint   Patient presents with   . Follow Up     FOLLOW UP ON COLONOSCOPY AND EGD 5/26         HPI  Troy Wallace is a 50 y.o. male who presents today for follow up after testing and after colonoscopy and EGD. He was initially seen by Dr. Diona Foley in April with cramping abdominal pain, nausea and change in bowel habits. He underwent RUQ [place], HIDA scan and upper and lower endoscopy. His endoscopic evaluation was relatively normal. RUQ without stones. HIDA scan with 5% EF. Patient still has ongoing nausea. He complains of epigastric abdominal pain. Bowel function has improved. He would like to proceed with cholecystectomy.     Allergies:  No Known Allergies    Current Outpatient Prescriptions  Outpatient Medications Marked as Taking for the 08/29/20 encounter (Office Visit) with Peggyann Juba, PA-C   Medication Sig   . atorvastatin (LIPITOR) 10 mg Oral Tablet Take 10 mg by mouth Every night   . buPROPion (WELLBUTRIN XL) 300 mg extended release 24 hr tablet Take 300 mg by mouth Once a day   . COCONUT OIL ORAL Take 1 Capsule by mouth Twice daily   . lamoTRIgine (LAMICTAL) 25 mg Oral Tablet Take 25 mg by mouth Twice daily 125 mg twice a day   . nortriptyline (PAMELOR) 25 mg Oral Capsule TAKE ONE CAPSULE BY MOUTH IN THE EVENING   . omega-3-DHA-EPA-fish oil 1,000 mg (120 mg-180 mg) Oral Capsule Take 1,000 mg by mouth Once a day   . pediatric multivitamins Oral Tablet, Chewable Chew 1 Tablet Once a day   . QUEtiapine (SEROQUEL) 100 mg Oral Tablet TAKE 1/2 TO 1 TABLET BY MOUTH AT BEDTIME AS NEEDED for insomnia   . QUEtiapine (SEROQUEL) 200 mg Oral Tablet Take 200 mg by mouth Every night   . rizatriptan (MAXALT) 10 mg Oral Tablet take 1 tablet by  mouth as directed as needed   . sodium,potassium,mag sulfates (SUPREP BOWEL PREP KIT) 17.5-3.13-1.6 gram Oral Recon Soln Pour 6oz bottle of SUPREP into container,add cool water to 16oz line.Mix.Drink ALL the liquid.Then drink 32oz of water over the next hour.        Past Medical History:    Past Medical History:   Diagnosis Date   . Abdominal hernia    . Anxiety    . Esophageal reflux    . Migraines    . PONV (postoperative nausea and vomiting)            Past Surgical History:    Past Surgical History:   Procedure Laterality Date   . HX HAND SURGERY     . HX HERNIA REPAIR Bilateral 07/2014    laparoscopic bilateral inguinal hernia repair with Dr. Diona Foley   . SUBDURAL HEMATOMA EVACUATION VIA CRANIOTOMY             Family History:  Family Medical History:     Problem Relation (Age of Onset)    Colon Polyps Father, Brother    Diabetes Father    Heart Disease Father            Social History:    Social History  Socioeconomic History   . Marital status: Married   Tobacco Use   . Smoking status: Never Smoker   . Smokeless tobacco: Current User   Vaping Use   . Vaping Use: Never used   Substance and Sexual Activity   . Alcohol use: Never   . Drug use: Never       ROS:  General: Denies fever, chills, or any changes in weight.  EENT: Denies blurry vision, tinnitus, nasal congestion, or trouble with swallowing.  Cardiovascular: Denies chest pain, pain on exertion, or palpitations.  Respiratory: Denies SOB, cough, or asthma/COPD/emphysema.  Gastrointestinal: +epigastic abdominal pain, +N,  Denies any change in bowel movements or blood in stool.  GU: Denies dysuria, change in urinary habits, or hematuria.  Musculoskeletal: Denies weakness or joint pain. Denies trouble moving extremities.  Neurological: Denies HA, dizziness, hx of stroke.  Hematologic: Denies hx of bleeding disorders or blood clots.  Endocrine: Denies heat/cold intolerance, flushing, or change in glove/shoe size.  Psychiatric: Denies changes in mood, anxiety,  or depression.    Objective:  BP 109/74   Pulse 92   Ht 1.88 m (_0 )   Wt 77.1 kg (170 lb)   BMI 21.83 kg/m       Physical Exam:  Constitutional:AAOx3, WDWN, NAD  HEENT: Normocephalic, atraumatic. PERRLA.   Neck: Trachea midline, supple  Cardiovascular: RR  Pulmonary: Lungs CTA bilaterally; No wheezes, rales, or rhonchi observed. Normal respiratory effort. No retractions.  Abdomen: Abdomen soft and supple; No tenderness. Non-distended; No masses or HSM present. Bowel sounds normal and active in all 4 quadrants.  Extremities: No peripheral edema  Musculoskeletal: Normal muscle strength and tone of all four extremities.  Skin: No rashes or lesions present; Warm and dry. No jaundice.  Psychiatric: Normal mood and affect; Judgement and thought content normal.    Data:  Labs:  Pre-Op Ordered    EKG:  Pre-OP Ordered    Endoscopic studies:    EGD and Colonoscopy completed on 5/26: normal evaluations. EGD negative for H. Pylori       Imaging studies:  I have reviewed all available imagining studies:     06/27/20: RUQ [place]: normal evaluation   08/01/20: HIDA scan: 5% EF     CXR: Pre-Op Ordered     Assessment:  1. Biliary dyskinesia         Plan:  1. Will proceed with laparoscopic cholecystectomy. Risks including bleeding, infection, injury to the bile duct, persistent symptoms were reviewed with the patient. He is in agreement to proceed.     He will return to clinic post-operatively. He was given the opportunity to ask questions, and those questions were appropriately answered. He agreed with the treatment plan and is encouraged to call with any additional questions or concerns.     Patient was seen independently. Dr. Diona Foley was available via telephone for consultation.     I independently of the faculty provider spent a total of (31) minutes in direct/indirect care of this patient including initial evaluation, review of laboratory, radiology, diagnostic studies, review of medical record, order entry and coordination of  care.    Peggyann Juba, PA-C  08/29/2020, 13:23

## 2020-08-29 NOTE — Nursing Note (Signed)
Patient is here for a follow up exam on N COLONOSCOPY AND EGD 5/26

## 2020-09-01 ENCOUNTER — Telehealth (INDEPENDENT_AMBULATORY_CARE_PROVIDER_SITE_OTHER): Payer: Self-pay | Admitting: Surgery

## 2020-09-01 NOTE — Telephone Encounter (Signed)
[employer] - Spoke to [facility] @ 11:25am    NO Authorization is required for upcoming surgery 09/17/2020    CPT Code 24580    Diagnosis Code K82.8    Call ref # [id number]

## 2020-09-05 ENCOUNTER — Ambulatory Visit (HOSPITAL_BASED_OUTPATIENT_CLINIC_OR_DEPARTMENT_OTHER)
Admission: RE | Admit: 2020-09-05 | Discharge: 2020-09-05 | Disposition: A | Discharge status: Home / Self Care | Payer: PPO | Source: Ambulatory Visit | Attending: Physician Assistant | Admitting: Physician Assistant

## 2020-09-05 ENCOUNTER — Ambulatory Visit (HOSPITAL_COMMUNITY): Payer: PPO

## 2020-09-05 ENCOUNTER — Other Ambulatory Visit: Payer: Self-pay

## 2020-09-05 ENCOUNTER — Ambulatory Visit
Admission: RE | Admit: 2020-09-05 | Discharge: 2020-09-05 | Disposition: A | Discharge status: Home / Self Care | Payer: PPO | Source: Ambulatory Visit | Attending: Physician Assistant | Admitting: Physician Assistant

## 2020-09-05 DIAGNOSIS — K828 Other specified diseases of gallbladder: Secondary | ICD-10-CM

## 2020-09-05 DIAGNOSIS — I4510 Unspecified right bundle-branch block: Secondary | ICD-10-CM

## 2020-09-05 LAB — BASIC METABOLIC PANEL
ANION GAP: 6 mmol/L (ref 6–15)
BUN: 20 mg/dL (ref 7–21)
CALCIUM: 9.7 mg/dL (ref 8.0–10.6)
CHLORIDE: 102 mmol/L (ref 98–107)
CO2 TOTAL: 32 mmol/L (ref 21–32)
CREATININE: 1.47 mg/dL (ref 0.80–1.60)
ESTIMATED GFR: 58 mL/min/{1.73_m2}
GLUCOSE: 95 mg/dL (ref 70–100)
POTASSIUM: 4.7 mmol/L (ref 3.3–5.1)
SODIUM: 140 mmol/L (ref 136–146)

## 2020-09-05 LAB — CBC
HCT: 49 % (ref 38.9–52.0)
HGB: 16.2 g/dL (ref 13.4–17.5)
MCH: 31.4 pg (ref 26.0–32.0)
MCHC: 33.1 g/dL (ref 31.0–35.5)
MCV: 95 fL (ref 78.0–100.0)
MPV: 10.1 fL (ref 8.7–12.5)
PLATELETS: 237 10*3/uL (ref 150–400)
RBC: 5.16 10*6/uL (ref 4.50–6.10)
RDW-CV: 13.4 % (ref 11.5–15.5)
WBC: 4.6 10*3/uL (ref 3.7–11.0)

## 2020-09-08 LAB — ECG 12 LEAD
Atrial Rate: 81 {beats}/min
Calculated P Axis: 78 degrees
Calculated R Axis: 107 degrees
Calculated T Axis: 71 degrees
PR Interval: 158 ms
QRS Duration: 100 ms
QT Interval: 364 ms
QTC Calculation: 422 ms
Ventricular rate: 81 {beats}/min

## 2020-09-10 ENCOUNTER — Encounter (HOSPITAL_COMMUNITY): Payer: Self-pay | Admitting: Surgery

## 2020-09-15 NOTE — Care Management Notes (Signed)
Procedure (Laparoscopic Cholecystectomy) will be done by Dr. Lizbeth Bark on 09/17/2020. Case will be done as an Outpatient/Day Surgery.  [employer] Insurance coverage is active as of 03/22/2020.  CPT Code(s) O4060964 is valid.  Prior Authorization is not required. Refer to telephone conversation done by Kate Sable, Authorization Specialist, on 09/01/2020:  [employer] - Spoke to [facility] @ 11:25am  NO Authorization is required for upcoming surgery 09/17/2020  CPT Code 34961  Diagnosis Code K82.8  Call ref # [id number]  Refer to H&P done by Dr. Newman Pies.   [facility], CASE MANAGER  09/15/2020, 09:06

## 2020-09-17 ENCOUNTER — Other Ambulatory Visit: Payer: Self-pay

## 2020-09-17 ENCOUNTER — Encounter (HOSPITAL_COMMUNITY): Payer: Self-pay | Admitting: Surgery

## 2020-09-17 ENCOUNTER — Inpatient Hospital Stay
Admission: RE | Admit: 2020-09-17 | Discharge: 2020-09-17 | Disposition: A | Discharge status: Home / Self Care | Payer: PPO | Source: Ambulatory Visit | Attending: Surgery | Admitting: Surgery

## 2020-09-17 ENCOUNTER — Encounter (HOSPITAL_COMMUNITY)
Admission: RE | Disposition: A | Discharge status: Home / Self Care | Payer: Self-pay | Source: Ambulatory Visit | Attending: Surgery

## 2020-09-17 ENCOUNTER — Ambulatory Visit (HOSPITAL_BASED_OUTPATIENT_CLINIC_OR_DEPARTMENT_OTHER): Payer: PPO | Admitting: Certified Registered"

## 2020-09-17 ENCOUNTER — Ambulatory Visit (HOSPITAL_COMMUNITY): Payer: PPO | Admitting: Certified Registered"

## 2020-09-17 DIAGNOSIS — K811 Chronic cholecystitis: Secondary | ICD-10-CM | POA: Insufficient documentation

## 2020-09-17 HISTORY — DX: Disease of biliary tract, unspecified: K83.9

## 2020-09-17 SURGERY — CHOLECYSTECTOMY LAPAROSCOPIC
Anesthesia: General | Site: Abdomen | Wound class: Clean Wound: Uninfected operative wounds in which no inflammation occurred

## 2020-09-17 MED ORDER — SODIUM CHLORIDE 0.9 % INTRAVENOUS SOLUTION
2.0000 g | Freq: Once | INTRAVENOUS | Status: AC
Start: 2020-09-17 — End: 2020-09-17
  Administered 2020-09-17: 2 g via INTRAVENOUS
  Filled 2020-09-17: qty 20

## 2020-09-17 MED ORDER — ROCURONIUM 10 MG/ML INTRAVENOUS SOLUTION
Freq: Once | INTRAVENOUS | Status: DC | PRN
Start: 2020-09-17 — End: 2020-09-17
  Administered 2020-09-17: 40 mg via INTRAVENOUS

## 2020-09-17 MED ORDER — PROPOFOL 10 MG/ML IV BOLUS
INJECTION | Freq: Once | INTRAVENOUS | Status: DC | PRN
Start: 2020-09-17 — End: 2020-09-17
  Administered 2020-09-17: 200 mg via INTRAVENOUS

## 2020-09-17 MED ORDER — SODIUM CHLORIDE 0.9 % (FLUSH) INJECTION SYRINGE
3.0000 mL | INJECTION | INTRAMUSCULAR | Status: DC | PRN
Start: 2020-09-17 — End: 2020-09-17

## 2020-09-17 MED ORDER — ACETAMINOPHEN 1,000 MG/100 ML (10 MG/ML) INTRAVENOUS SOLUTION
1000.0000 mg | INTRAVENOUS | Status: AC
Start: 2020-09-17 — End: 2020-09-17
  Administered 2020-09-17: 1000 mg via INTRAVENOUS
  Filled 2020-09-17: qty 100

## 2020-09-17 MED ORDER — BUPIVACAINE-EPINEPHRINE (PF) 0.5 %-1:200,000 INJECTION SOLUTION
Freq: Once | INTRAMUSCULAR | Status: DC | PRN
Start: 2020-09-17 — End: 2020-09-17
  Administered 2020-09-17: 10 mL via INTRAMUSCULAR

## 2020-09-17 MED ORDER — HYDROCODONE 5 MG-ACETAMINOPHEN 325 MG TABLET
1.0000 | ORAL_TABLET | ORAL | Status: DC | PRN
Start: 2020-09-17 — End: 2020-09-17
  Administered 2020-09-17: 1 via ORAL
  Filled 2020-09-17: qty 1

## 2020-09-17 MED ORDER — MIDAZOLAM (PF) 1 MG/ML INJECTION SOLUTION
Freq: Once | INTRAMUSCULAR | Status: DC | PRN
Start: 2020-09-17 — End: 2020-09-17
  Administered 2020-09-17: 2 mg via INTRAVENOUS

## 2020-09-17 MED ORDER — HYDROCODONE 5 MG-ACETAMINOPHEN 325 MG TABLET
1.0000 | ORAL_TABLET | Freq: Four times a day (QID) | ORAL | 0 refills | Status: AC | PRN
Start: 2020-09-17 — End: ?

## 2020-09-17 MED ORDER — HYDROMORPHONE 0.5 MG/0.5 ML INJECTION SYRINGE
0.5000 mg | INJECTION | INTRAMUSCULAR | Status: DC | PRN
Start: 2020-09-17 — End: 2020-09-17
  Administered 2020-09-17 (×3): 0.5 mg via INTRAVENOUS
  Filled 2020-09-17 (×3): qty 0.5

## 2020-09-17 MED ORDER — FENTANYL (PF) 50 MCG/ML INJECTION SOLUTION
Freq: Once | INTRAMUSCULAR | Status: DC | PRN
Start: 2020-09-17 — End: 2020-09-17
  Administered 2020-09-17 (×2): 50 ug via INTRAVENOUS

## 2020-09-17 MED ORDER — LACTATED RINGERS INTRAVENOUS SOLUTION
INTRAVENOUS | Status: DC
Start: 2020-09-17 — End: 2020-09-17
  Administered 2020-09-17 (×2): 0 via INTRAVENOUS

## 2020-09-17 MED ORDER — LIDOCAINE (PF) 100 MG/5 ML (2 %) INTRAVENOUS SYRINGE
INJECTION | Freq: Once | INTRAVENOUS | Status: DC | PRN
Start: 2020-09-17 — End: 2020-09-17
  Administered 2020-09-17: 50 mg via INTRAVENOUS

## 2020-09-17 MED ORDER — BUPIVACAINE-EPINEPHRINE (PF) 0.5 %-1:200,000 INJECTION SOLUTION
INTRAMUSCULAR | Status: AC
Start: 2020-09-17 — End: 2020-09-17
  Filled 2020-09-17: qty 20

## 2020-09-17 MED ORDER — SODIUM CHLORIDE 0.9 % (FLUSH) INJECTION SYRINGE
3.0000 mL | INJECTION | Freq: Three times a day (TID) | INTRAMUSCULAR | Status: DC
Start: 2020-09-17 — End: 2020-09-17

## 2020-09-17 MED ORDER — KETOROLAC 30 MG/ML (1 ML) INJECTION SOLUTION
Freq: Once | INTRAMUSCULAR | Status: DC | PRN
Start: 2020-09-17 — End: 2020-09-17
  Administered 2020-09-17: 30 mg via INTRAVENOUS

## 2020-09-17 MED ORDER — ONDANSETRON HCL (PF) 4 MG/2 ML INJECTION SOLUTION
Freq: Once | INTRAMUSCULAR | Status: DC | PRN
Start: 2020-09-17 — End: 2020-09-17
  Administered 2020-09-17: 4 mg via INTRAVENOUS

## 2020-09-17 MED ORDER — DEXAMETHASONE SODIUM PHOSPHATE 4 MG/ML INJECTION SOLUTION
Freq: Once | INTRAMUSCULAR | Status: DC | PRN
Start: 2020-09-17 — End: 2020-09-17
  Administered 2020-09-17: 8 mg via INTRAVENOUS

## 2020-09-17 MED ORDER — SUGAMMADEX 100 MG/ML INTRAVENOUS SOLUTION
Freq: Once | INTRAVENOUS | Status: DC | PRN
Start: 2020-09-17 — End: 2020-09-17
  Administered 2020-09-17: 200 mg via INTRAVENOUS

## 2020-09-17 SURGICAL SUPPLY — 41 items
APPLICATOR ARISTA AM0005 FLXTI APPLICATOR SURGICAL ARISTA AH (SUPP) IMPLANT
BAND-AID STRIP 3/4 X 3 100/BX TRP PE ADH NATURAL STRL LF (WOUND CARE SUPPLY) ×3 IMPLANT
BAND-AID STRIP 3/4 X 3 100/BX TRP PE ADH NATURAL STRL LF (WOUND CARE/ENTEROSTOMAL SUPPLY) ×3
BASKET 340121 STONE RETREV INACT 11/21 FOR WORKDAY (UROLOGICAL SUPPLIES) IMPLANT
BIOPSY SYSTEM MC1820 18GX20CM OR ANG 22MM 2 TRGR UL SHRP TIP (SUPP) IMPLANT
BIOPSY SYSTEM MC1825 18GX25CM OR 22MM ANG 2 TRGR UL SHRP TIP (SUPP) IMPLANT
CANISTER 2000 SUCT BLUE TOP FLUSHABLE (SUPP) ×1 IMPLANT
CANNULA CTS02 5X100THREAD12/BX (SUPP) ×4 IMPLANT
CLIP APPLIER EL5ML 5MM 3/BX EL5ML (SUPP) ×1 IMPLANT
CONTROL SYRINGE 12ML CONTROL SYRINGE 12ML (SYRN) ×2 IMPLANT
COTTON BALL MED STERILE RB62 COTTON BALL MED STERILE (MED) ×2 IMPLANT
DRAPE C-ARM 42X72 4951 was DRAPE C-ARM 42X7 27CA104 (DRAPE/PACKS/SHEETS/OR TOWEL) IMPLANT
DRESSING SPONGE 4X4 12PLY25/BX DRESSING SPONGE 4X4 (WOUND CARE SUPPLY) ×3 IMPLANT
DRESSING TELFA 3 X 4 1050 1050 50/BX (WOUND CARE SUPPLY) IMPLANT
GLOVE 7.5 PERRY WHIT 5711104PF GLOVE 5711104PF PERRY WHIT 7. (GLOVES AND ACCESSORIES) ×2 IMPLANT
GLOVE HYDRASOFT SZ 7 50/BX GLOVE HYDRASOFT SZ 7 50/BX (SUPP) ×1 IMPLANT
GOWN AERO LG 32/CS was GOWN AERO 44673 LG 32/CS (GOWN) ×2 IMPLANT
GOWN DYNJP2401 ULTRA LG 32/CS GOWN DYNJP2401 ULTRA LG 32/CS (GOWN) ×2 IMPLANT
HARMONIC ACE +7 SHEARS +7 CRV ADV HMSTS 3 HNDCNTL ACT (CUTTING ELEMENTS)
HARMONIC ACE +7 SHEARS +7 CRV ADV HMSTS 3 HNDCNTL ACT (SURGICAL CUTTING SUPPLIES) IMPLANT
HEMOSTAT 1952 SURGICEL 4INX8IN HEMOSTAT ABSORBABLE SURGICEL (SUPP) IMPLANT
INTRODUCER 15713B 7FR 10/BX INTRODUCER 15713B 7FR 10/BX (SUPP) IMPLANT
LIGACLIP ER420 LG 3/BX ANG JAW DIST TIP CLOSE (SUPP) IMPLANT
NEEDLE PN150 INSUF 150MM 12/BX NEEDLE PN150 INSUF 150MM 12/B (SUPP) IMPLANT
PACK DYNJ902070C GEN LAPAROSCO PACK GENERAL LAPAROSCOPY (CUSTOM TRAYS & PACK) ×1 IMPLANT
RETRACTOR 176613 LAP RETRACTOR 176613 LAP (SUPP) IMPLANT
RETRIEVAL SYSTEM CD001 MONARCH IDE BEAD STD ENDOS 225ML LF (SUPP) ×2 IMPLANT
SOL SOD CHL.9 1000ML BG 798309 SOL SOD CHL 1000ML WAS 798348 (IRRG) ×1 IMPLANT
SOL WATER 1500ML BOTTLE SOL WATER 1500ML PB (IRRG) ×1 IMPLANT
STERI-STRIP 1/2 R1547 R1547 6/PK 50PK/BX (WOUND CARE SUPPLY) IMPLANT
STERISTRIP MW8512 3M STERISTRIP MW8512 3M (WOUND CARE SUPPLY) ×2 IMPLANT
SUTURE GRASPER FCL201100 TROCA (SUPP) IMPLANT
SUTURE J496H 40 VICRYL 36/BX N UNDYED BRD COAT ABS (SUTURE/WOUND CLOSURE) ×2 IMPLANT
SUTURE J603H 0 VICRYL 36/BX BRD COAT ABS (SUTURE/WOUND CLOSURE) ×1 IMPLANT
SYRINGE 20CC LUER LOK TIP (SUPP) IMPLANT
SYRINGE 309654 SLIP TIP 60CC YRG DEHP-FR PVC FREE MED DISP (SUPP) IMPLANT
TROCAR CTB03 5MM BLADED 6/BX (SUPP) ×2 IMPLANT
TROCAR CTB73 12MM BLADED 6/BX TROCAR ZTHREAD 12MM (SUPP) IMPLANT
TROCAR CTF03 5X100 NONTHR 6/BX CTF03 (SUPP) ×1 IMPLANT
TROCAR CTF33 NONBLAD 11MM 6/BX (SUPP) ×1 IMPLANT
TUBING CYSTOSCOPY IRRIGATION TUBING CYSTOSCOPY IRRIGATION (SUPP) ×1 IMPLANT

## 2020-09-17 NOTE — Anesthesia Procedure Notes (Signed)
Airway Note  General Information and Staff   Authorizing provider: Terisa Starr, MD  Performing provider: Dorris Singh, Mardella Layman, CRNA          Date/Time: 09/17/2020 12:59 PM  Urgency: elective    Airway not difficult    Indications and Patient Condition  Pt location: In Or  Indications for airway management: anesthesia  Spontaneous ventilation: present  Sedation level: deep  Preoxygenated: yes  Patient position: sniffing  Mask difficulty assessment: 2 - vent by mask + OA or adjuvant +/- NMBA        Final Airway Details  Final airway type: endotracheal airway        Successful airway: ETT and cuffed     Successful intubation technique: direct laryngoscopy  Facilitating devices/methods: intubating stylet and OPA ( OPA for BMV atraumatic)            Endotracheal tube insertion site: oral  Blade: Macintosh  Blade size: #3+  Airway size (mm): 7.5  Cormack-Lehane Classification: grade IIa - partial view of glottis  Placement verified by: chest auscultation   Marked at 24 and right  Measured from: teeth  Secured with: Tape  Number of attempts at approach: 1  Number of other approaches attempted: 0Airway complications: Atraumatic

## 2020-09-17 NOTE — Anesthesia Transfer of Care (Signed)
ANESTHESIA TRANSFER OF CARE   Troy Wallace is a 50 y.o. ,male, Weight: 72.6 kg (160 lb)   had Procedure(s):  CHOLECYSTECTOMY LAPAROSCOPIC  performed  09/17/20   Primary Service: Lizbeth Bark, MD    Past Medical History:   Diagnosis Date   . Anxiety    . Biliary and gallbladder disorder    . Esophageal reflux    . Migraines    . PONV (postoperative nausea and vomiting)       Allergy History as of 09/17/20      No Known Allergies              I completed my transfer of care / handoff to the receiving personnel during which we discussed:  Access, Airway, All key/critical aspects of case discussed, Analgesia, Antibiotics, Expectation of post procedure, Fluids/Product, Gave opportunity for questions and acknowledgement of understanding, Labs and PMHx    Post Location: PACU                                       Report given to: Roosvelt Harps, RN                           Last OR Temp: Temperature: 36.2 C (97.2 F)  ABG:  POTASSIUM   Date Value Ref Range Status   09/05/2020 4.7 3.3 - 5.1 mmol/L Final     CALCIUM   Date Value Ref Range Status   09/05/2020 9.7 8.0 - 10.6 mg/dL Final     Calculated P Axis   Date Value Ref Range Status   09/05/2020 78 degrees Final     Calculated R Axis   Date Value Ref Range Status   09/05/2020 107 degrees Final     Calculated T Axis   Date Value Ref Range Status   09/05/2020 71 degrees Final     Airway:* No LDAs found *  Blood pressure 117/77, pulse 60, temperature 36.2 C (97.2 F), resp. rate 13, height 1.88 m (6\' 2" ), weight 72.6 kg (160 lb), SpO2 99 %.

## 2020-09-17 NOTE — Discharge Instructions (Addendum)
SURGICAL DISCHARGE INSTRUCTIONS     Dr. Ball, Brandon, MD  performed your CHOLECYSTECTOMY LAPAROSCOPIC today at the [facility]    [facility] (Ball/[place]/[facility])  Monday through Friday 8:00 am to 4:00 pm  [phone]      PLEASE SEE WRITTEN HANDOUTS AS DISCUSSED BY YOUR NURSE:  Alyza Artiaga    SIGNS AND SYMPTOMS OF A WOUND / INCISION INFECTION   Be sure to watch for the following:   Increase in redness or red streaks near or around the wound or incision.   Increase in pain that is intense or severe and cannot be relieved by the pain medication that your doctor has given you.   Increase in swelling that cannot be relieved by elevation of a body part, or by applying ice, if permitted.   Increase in drainage, or if yellow / green in color and smells bad. This could be on a dressing or a cast.   Increase in fever for longer than 24 hours, or an increase that is higher than 101 degrees Fahrenheit (normal body temperature is 98 degrees Fahrenheit). The incision may feel warm to the touch.    **CALL YOUR DOCTOR IF ONE OR MORE OF THESE SIGNS / SYMPTOMS SHOULD OCCUR.    ANESTHESIA INFORMATION   ANESTHESIA -- ADULT PATIENTS:  You have received intravenous sedation / general anesthesia, and you may feel drowsy and light-headed for several hours. You may even experience some forgetfulness of the procedure. DO NOT DRIVE A MOTOR VEHICLE or perform any activity requiring complete alertness or coordination until you feel fully awake in about 24-48 hours. Do not drink alcoholic beverages for at least 24 hours. Do not stay alone, you must have a responsible adult available to be with you. You may also experience a dry mouth or nausea for 24 hours. This is a normal side effect and will disappear as the effects of the medication wear off.    REMEMBER   If you experience any difficulty breathing, chest pain, bleeding that you feel is excessive, persistent nausea or vomiting or for  any other concerns:  Call your physician Dr.  Ball, Brandon, MD     at [phone] or [phone] You may also ask to have the general doctor on call paged. They are available to you 24 hours a day.    SPECIAL INSTRUCTIONS / COMMENTS   [facility] FOR LAPAROSCOPIC CHOLECYSTECTOMY- DR. BALL    1.  You may experience a sore throat from the breathing tube you had in during the procedure.    2.  You may experience a sore chest, shoulder discomfort and incision discomfort post-op.    3.  You will be given a prescription for pain medications, which may contain Tylenol (acetaminophen) and you should not take any additional tylenol or medications that contain tylenol while taking the prescription medication.  PRESCRIPTIONS MEDICATIONS SHOULD NOT BE TAKEN WHEN DRIVING OR CONSUMING ALCOHOL.     4.  Keep all post operative dressings clean and dry.    5.  You can shower or wash lightly over the plastic dressing in 24 hours.  Pat dry.  Remove all dressings in 4 days.  You can leave the incisions open to air or apply clean band-aids.    6.  Small pieces of white tape (steri-strips) will slowly fall off.    7.  Start light with your diet- such as soup, toast, crackers and jello.  As the nausea clears from the   anesthetic and surgery you can slowly increase your diet.    8.  You can walk and use stairs as long as you are not groggy from the anesthetic or pain medication.    9.  No heavy lifting until seen by the doctor.    10.  If you become constipated, increase your fluid intake.   Over the counter stool softeners may be taken.    11.  Notify your doctor if difficulty with urination.                    FOLLOW-UP APPOINTMENTS   Please call your surgeon's office at the number listed about  to schedule a date / time of return.

## 2020-09-17 NOTE — Anesthesia Preprocedure Evaluation (Signed)
ANESTHESIA PRE-OP EVALUATION  Planned Procedure: CHOLECYSTECTOMY LAPAROSCOPIC (N/A Abdomen)  Review of Systems    PONV                 Pulmonary     Cardiovascular    No peripheral edema,        GI/Hepatic/Renal    Abd pain and GERD        Endo/Other         Neuro/Psych/MS    Subdural 50 yrs old, headaches and anxiety     Cancer                      Physical Assessment      Airway       Mallampati: II    TM distance: >3 FB    Neck ROM: full  Mouth Opening: good.            Dental                    Pulmonary    Breath sounds clear to auscultation  (-) no rhonchi, no decreased breath sounds, no wheezes, no rales and no stridor     Cardiovascular    Rhythm: regular  Rate: Normal  (-) no friction rub, carotid bruit is not present, no peripheral edema and no murmur     Other findings            Plan  ASA 2     Planned anesthesia type: general                     Intravenous induction       Anesthetic plan and risks discussed with patient             Patient's NPO status is appropriate for Anesthesia.

## 2020-09-17 NOTE — OR Surgeon (Signed)
[facility]  Operative Note    Patient Name:  Troy Wallace  Patient MRN:  [id number]  Patient CSN: [id number]  Patient DOB:  1970/05/05  Date of Service:  09/17/2020    PREOPERATIVE DIAGNOSIS:    1. Chronic cholecystitis    POSTOPERATIVE DIAGNOSES:    1. Chronic cholecystitis    NAME OF PROCEDURE:  laparoscopic cholecystectomy CPT [phone]  OPERATIVE SUMMARY:  After informed consent was obtained.  Patient taken operating room placed under general endotracheal anesthesia DVT cups were placed preoperative antibiotics given his prepped and draped in usual sterile fashion.  Supraumbilical incision made after being  anesthetized with 0.5% Marcaine with epinephrine.  Direct cutdown technique was utilized 10 mm trocar introduced up difficulty abdomen insufflated 14 mm marker at this time 3 5 mm ports were placed 1 the subxiphoid 2 subcostal regions the gallbladder was grasped retracted cephalad the cystic duct and cystic artery were dissected free and clearly identified.  The critical view was obtained.  Three clamps placed on proximal cystic duct 1 on distal cystic duct to clamp sites are proximal cystic artery 1 on the distal cystic artery these were transected using hook scissors gallbladder was removed the gallbladder fossa electrocautery hemostasis was excellent the gallbladder space endo-pouch and removed through the umbilical port site the umbilical port site was closed under direct laparoscopic visualization with an 0 Vicryl suture the meaning CO2 and irrigation were removed ports withdrawn skin was closed with 4-0 Vicryl please note that my assistant Adelina Mings cathell assisted with the entire case due to lack of available qualified help and there are no residents our institution  ESTIMATED BLOOD LOSS:  5 cc    ATTENDING SURGEON:  Raynelle Highland Bellina Tokarczyk  ASSISTANT SURGEON:   Adelina Mings cathell  ANESTHESIA:  general  COMPLICATIONS:  none.   DISPOSITION:  To recovery    Lizbeth Bark, MD

## 2020-09-17 NOTE — Interval H&P Note (Signed)
[facility]      H&P UPDATE FORM                                                                                  Troy Wallace, Troy Wallace, 50 y.o. male  Date of Admission:  09/17/2020  Date of Birth:  1970/08/25    09/17/2020    STOP: IF H&P IS GREATER THAN 30 DAYS FROM SURGICAL DAY COMPLETE NEW H&P IS REQUIRED.     H & P updated the day of the procedure.  1.  H&P completed within 30 days of surgical procedure and has been reviewed within 24 hours of admission but prior to surgery or a procedure requiring anesthesia services, the patient has been examined, and no change has occured in the patients condition since the H&P was completed.       Change in medications: No              Comments:     2.  Patient continues to be appropriate candidate for planned surgical procedure. YES    Lizbeth Bark, MD

## 2020-09-17 NOTE — Anesthesia Postprocedure Evaluation (Signed)
Anesthesia Post Op Evaluation    Patient: Troy Wallace  Procedure(s):  CHOLECYSTECTOMY LAPAROSCOPIC    Last Vitals:Temperature: 36.2 C (97.2 F) (09/17/20 1357)  Heart Rate: 60 (09/17/20 1357)  BP (Non-Invasive): 117/77 (09/17/20 1357)  Respiratory Rate: 13 (09/17/20 1357)  SpO2: 99 % (09/17/20 1357)    No complications documented.    Patient is sufficiently recovered from the effects of anesthesia to participate in the evaluation and has returned to their pre-procedure level.  Patient location during evaluation: PACU       Patient participation: complete - patient participated  Level of consciousness: awake and alert and responsive to verbal stimuli    Pain score: 1  Pain management: adequate  Airway patency: patent    Anesthetic complications: no  Cardiovascular status: acceptable  Respiratory status: acceptable  Hydration status: acceptable  Patient post-procedure temperature: Pt Normothermic   PONV Status: Absent

## 2020-09-23 ENCOUNTER — Telehealth (INDEPENDENT_AMBULATORY_CARE_PROVIDER_SITE_OTHER): Payer: Self-pay | Admitting: Physician Assistant

## 2020-09-23 NOTE — Telephone Encounter (Signed)
Left vm informing appt on 10/03/20 is now cancelled and to please call back to reschuedule, 1st attempt

## 2020-09-24 LAB — SURGICAL PATHOLOGY SPECIMEN

## 2020-09-26 ENCOUNTER — Ambulatory Visit: Payer: PPO | Attending: Physician Assistant | Admitting: Physician Assistant

## 2020-09-26 ENCOUNTER — Encounter (INDEPENDENT_AMBULATORY_CARE_PROVIDER_SITE_OTHER): Payer: Self-pay | Admitting: Physician Assistant

## 2020-09-26 ENCOUNTER — Other Ambulatory Visit: Payer: Self-pay

## 2020-09-26 VITALS — BP 106/71 | HR 97 | Ht 74.0 in | Wt 161.0 lb

## 2020-09-26 DIAGNOSIS — Z48815 Encounter for surgical aftercare following surgery on the digestive system: Secondary | ICD-10-CM

## 2020-09-26 DIAGNOSIS — Z9049 Acquired absence of other specified parts of digestive tract: Secondary | ICD-10-CM

## 2020-09-26 NOTE — Progress Notes (Signed)
Troy Wallace  50 y.o.  male  [id number]      General Surgery Post Op Visit    Subjective    Chief Complaint   Patient presents with   . Post Op     S/P LAP CHOLE 09/17/20         Patient is a 50 y.o. male who presents today for post-op visit.      Patient is tolerating a diet, having normal bowel function. Denies current pain. Still having some on/off nausea but overall better.     Patient's Medications   New Prescriptions    No medications on file   Previous Medications    ATORVASTATIN (LIPITOR) 10 MG ORAL TABLET    Take 10 mg by mouth Every night    BUPROPION (WELLBUTRIN XL) 300 MG EXTENDED RELEASE 24 HR TABLET    Take 300 mg by mouth Once a day    COCONUT OIL ORAL    Take 1 Capsule by mouth Twice daily    HYDROCODONE-ACETAMINOPHEN (NORCO) 5-325 MG ORAL TABLET    Take 1 Tablet by mouth Every 6 hours as needed for Pain    LAMOTRIGINE ORAL    Take 125 mg by mouth Twice daily    MULTIVITAMIN ORAL    Chew 1 Tablet Once a day    NORTRIPTYLINE (PAMELOR) 25 MG ORAL CAPSULE    TAKE ONE CAPSULE BY MOUTH IN THE EVENING    OMEGA-3-DHA-EPA-FISH OIL 1,000 MG (120 MG-180 MG) ORAL CAPSULE    Take 1,000 mg by mouth Once a day    OMEPRAZOLE (PRILOSEC) 20 MG ORAL CAPSULE, DELAYED RELEASE(E.C.)    Take 20 mg by mouth Twice daily    QUETIAPINE (SEROQUEL) 100 MG ORAL TABLET    Take 1/2 to 1 tablet as needed for insomnia  in addition to 200 mg nightly    QUETIAPINE (SEROQUEL) 200 MG ORAL TABLET    Take 200 mg by mouth Every night    RIZATRIPTAN (MAXALT) 10 MG ORAL TABLET    Take 10 mg by mouth Once, as needed for Migraine   Modified Medications    No medications on file   Discontinued Medications    No medications on file        Objective    Physical Exam  Vitals:    09/26/20 1122   BP: 106/71   Pulse: 97   Weight: 73 kg (161 lb)   Height: 1.88 m (6\' 2" )   BMI: 20.71         Body mass index is 20.67 kg/m.   General:  NAD.  Lungs: Breathing unlabored.  Abdomen:  S, NT, ND; surgical sites clean, dry and intact, no discharge,  erythema   Skin:  Skin warm and dry.      Assessment    S/p laparoscopic cholecystectomy on 6/29 for dysfunctional gallbladder by Dr. 7/29     Plan    -doing well post op  -pathology reviewed:   Mild chronic cholecystitis.  One benign lymph node with reactive changes.  -patient will follow up PRN       Newman Pies, PA-C  09/26/2020    Patient was seen independently. Collaborative physician was available via telephone for consultation.

## 2020-09-26 NOTE — Nursing Note (Signed)
Patient presents today for a post op exam for *S/P LAP CHOLE 09/17/20

## 2020-10-03 ENCOUNTER — Encounter (INDEPENDENT_AMBULATORY_CARE_PROVIDER_SITE_OTHER): Payer: Self-pay | Admitting: Physician Assistant

## 2021-04-24 ENCOUNTER — Other Ambulatory Visit: Payer: 59 | Attending: INTERNAL MEDICINE

## 2021-04-24 ENCOUNTER — Other Ambulatory Visit: Payer: Self-pay

## 2021-04-24 DIAGNOSIS — I519 Heart disease, unspecified: Secondary | ICD-10-CM | POA: Insufficient documentation

## 2021-04-24 DIAGNOSIS — E785 Hyperlipidemia, unspecified: Secondary | ICD-10-CM | POA: Insufficient documentation

## 2021-04-24 DIAGNOSIS — E039 Hypothyroidism, unspecified: Secondary | ICD-10-CM | POA: Insufficient documentation

## 2021-04-24 DIAGNOSIS — Z0000 Encounter for general adult medical examination without abnormal findings: Secondary | ICD-10-CM | POA: Insufficient documentation

## 2021-04-24 LAB — COMPREHENSIVE METABOLIC PANEL, NON-FASTING
ALBUMIN/GLOBULIN RATIO: 1.7 (ref >=1.0)
ALBUMIN: 4.4 g/dL (ref 3.5–5.2)
ALKALINE PHOSPHATASE: 56 U/L (ref 41–133)
ALT (SGPT): 16 U/L (ref 0–55)
ANION GAP: 8 mmol/L (ref 6–15)
AST (SGOT): 17 U/L (ref 5–34)
BILIRUBIN TOTAL: 0.5 mg/dL (ref 0.2–1.2)
BUN: 18 mg/dL (ref 7–21)
CALCIUM: 9.1 mg/dL (ref 8.0–10.6)
CHLORIDE: 105 mmol/L (ref 98–107)
CO2 TOTAL: 30 mmol/L (ref 21–32)
CREATININE: 1.33 mg/dL (ref 0.80–1.60)
ESTIMATED GFR: >60 mL/min/{1.73_m2}
GLUCOSE: 106 mg/dL — ABNORMAL HIGH (ref 70–100)
POTASSIUM: 4.1 mmol/L (ref 3.3–5.1)
PROTEIN TOTAL: 7 g/dL (ref 6.4–8.3)
SODIUM: 143 mmol/L (ref 136–146)

## 2021-04-24 LAB — THYROID STIMULATING HORMONE (SENSITIVE TSH): TSH: 10.574 u[IU]/mL — ABNORMAL HIGH (ref 0.450–5.330)

## 2021-04-24 LAB — CBC WITH DIFF
BASOPHIL #: <0.1 10*3/uL (ref <=0.20)
BASOPHIL %: 1 %
EOSINOPHIL #: 0.16 10*3/uL (ref <=0.50)
EOSINOPHIL %: 3 %
HCT: 48.8 % (ref 38.9–52.0)
HGB: 16.8 g/dL (ref 13.4–17.5)
IMMATURE GRANULOCYTE #: <0.1 10*3/uL (ref <0.10)
IMMATURE GRANULOCYTE %: 0 % (ref 0–1)
LYMPHOCYTE #: 1.74 10*3/uL (ref 1.00–4.80)
LYMPHOCYTE %: 36 %
MCH: 33.3 pg — ABNORMAL HIGH (ref 26.0–32.0)
MCHC: 34.4 g/dL (ref 31.0–35.5)
MCV: 96.8 fL (ref 78.0–100.0)
MONOCYTE #: 0.41 10*3/uL (ref 0.20–1.10)
MONOCYTE %: 9 %
MPV: 10.8 fL (ref 8.7–12.5)
NEUTROPHIL #: 2.42 10*3/uL (ref 1.50–7.70)
NEUTROPHIL %: 51 %
PLATELETS: 252 10*3/uL (ref 150–400)
RBC: 5.04 10*6/uL (ref 4.50–6.10)
RDW-CV: 13.3 % (ref 11.5–15.5)
WBC: 4.8 10*3/uL (ref 3.7–11.0)

## 2021-04-24 LAB — LIPID PANEL
CHOL/HDL RATIO: 3.4
CHOLESTEROL: 132 mg/dL (ref 75–200)
HDL CHOL: 39 mg/dL (ref 27–58)
LDL CALC: 75 mg/dL (ref 0–99)
TRIGLYCERIDES: 92 mg/dL (ref 28–153)
VLDL CALC: 18 mg/dL (ref 0–50)

## 2021-04-24 LAB — THYROXINE, FREE (FREE T4): THYROXINE (T4), FREE: 0.57 ng/dL — ABNORMAL LOW (ref 0.61–1.12)

## 2021-04-24 LAB — CREATINE KINASE (CK), TOTAL, SERUM: CREATINE KINASE: 55 U/L (ref 30–200)

## 2022-07-09 ENCOUNTER — Ambulatory Visit: Payer: 59 | Attending: INTERNAL MEDICINE

## 2022-07-09 ENCOUNTER — Other Ambulatory Visit: Payer: Self-pay

## 2022-07-09 DIAGNOSIS — Z0000 Encounter for general adult medical examination without abnormal findings: Secondary | ICD-10-CM | POA: Insufficient documentation

## 2022-07-09 DIAGNOSIS — E785 Hyperlipidemia, unspecified: Secondary | ICD-10-CM | POA: Insufficient documentation

## 2022-07-09 LAB — CBC WITH DIFF
BASOPHIL #: <0.1 10*3/uL (ref <=0.20)
BASOPHIL %: 1 %
EOSINOPHIL #: 0.17 10*3/uL (ref <=0.50)
EOSINOPHIL %: 4 %
HCT: 44.4 % (ref 38.9–52.0)
HGB: 14.9 g/dL (ref 13.4–17.5)
IMMATURE GRANULOCYTE #: <0.1 10*3/uL (ref <0.10)
IMMATURE GRANULOCYTE %: 0 % (ref 0.0–1.0)
LYMPHOCYTE #: 1.68 10*3/uL (ref 1.00–4.80)
LYMPHOCYTE %: 35 %
MCH: 31.6 pg (ref 26.0–32.0)
MCHC: 33.6 g/dL (ref 31.0–35.5)
MCV: 94.1 fL (ref 78.0–100.0)
MONOCYTE #: 0.5 10*3/uL (ref 0.20–1.10)
MONOCYTE %: 10 %
MPV: 10.3 fL (ref 8.7–12.5)
NEUTROPHIL #: 2.4 10*3/uL (ref 1.50–7.70)
NEUTROPHIL %: 50 %
PLATELETS: 249 10*3/uL (ref 150–400)
RBC: 4.72 10*6/uL (ref 4.50–6.10)
RDW-CV: 13.9 % (ref 11.5–15.5)
WBC: 4.8 10*3/uL (ref 3.7–11.0)

## 2022-07-09 LAB — COMPREHENSIVE METABOLIC PANEL, NON-FASTING
ALBUMIN: 4.1 g/dL (ref 3.5–5.0)
ALKALINE PHOSPHATASE: 71 U/L (ref 45–115)
ALT (SGPT): 23 U/L (ref 10–55)
ANION GAP: 8 mmol/L (ref 4–13)
AST (SGOT): 29 U/L (ref 8–45)
BILIRUBIN TOTAL: 0.8 mg/dL (ref 0.3–1.3)
BUN/CREA RATIO: 18 (ref 6–22)
BUN: 24 mg/dL (ref 8–25)
CALCIUM: 8.8 mg/dL (ref 8.6–10.2)
CHLORIDE: 106 mmol/L (ref 96–111)
CO2 TOTAL: 26 mmol/L (ref 22–30)
CREATININE: 1.3 mg/dL (ref 0.75–1.35)
ESTIMATED GFR - MALE: 67 mL/min/BSA (ref >=60)
GLUCOSE: 93 mg/dL (ref 65–125)
POTASSIUM: 4.2 mmol/L (ref 3.5–5.1)
PROTEIN TOTAL: 7 g/dL (ref 6.4–8.3)
SODIUM: 140 mmol/L (ref 136–145)

## 2022-07-09 LAB — LIPID PANEL
CHOL/HDL RATIO: 3.9
CHOLESTEROL: 162 mg/dL (ref 100–200)
HDL CHOL: 42 mg/dL — ABNORMAL LOW (ref >=50)
LDL CALC: 102 mg/dL — ABNORMAL HIGH (ref <100)
NON-HDL: 120 mg/dL (ref <=190)
TRIGLYCERIDES: 100 mg/dL (ref <150)
VLDL CALC: 16 mg/dL (ref <30)

## 2022-07-09 LAB — CREATINE KINASE (CK), TOTAL, SERUM OR PLASMA: CREATINE KINASE: 456 U/L — ABNORMAL HIGH (ref 45–225)

## 2022-07-09 LAB — THYROXINE, FREE (FREE T4): THYROXINE (T4), FREE: 0.6 ng/dL — ABNORMAL LOW (ref 0.70–1.48)

## 2022-07-09 LAB — THYROID STIMULATING HORMONE (SENSITIVE TSH): TSH: 15.114 u[IU]/mL — ABNORMAL HIGH (ref 0.350–4.940)

## 2022-08-09 ENCOUNTER — Other Ambulatory Visit (HOSPITAL_COMMUNITY): Payer: Self-pay | Admitting: Adult Reconstructive Orthopaedic Surgery

## 2022-08-09 ENCOUNTER — Inpatient Hospital Stay (HOSPITAL_BASED_OUTPATIENT_CLINIC_OR_DEPARTMENT_OTHER)
Admission: RE | Admit: 2022-08-09 | Discharge: 2022-08-09 | Disposition: A | Discharge status: Home / Self Care | Payer: Worker's Comp, Other unspecified | Source: Ambulatory Visit

## 2022-08-09 ENCOUNTER — Inpatient Hospital Stay
Admission: RE | Admit: 2022-08-09 | Discharge: 2022-08-09 | Disposition: A | Discharge status: Home / Self Care | Payer: Worker's Comp, Other unspecified | Source: Ambulatory Visit | Attending: Adult Reconstructive Orthopaedic Surgery | Admitting: Adult Reconstructive Orthopaedic Surgery

## 2022-08-09 ENCOUNTER — Other Ambulatory Visit: Payer: Self-pay

## 2022-08-09 ENCOUNTER — Encounter (HOSPITAL_BASED_OUTPATIENT_CLINIC_OR_DEPARTMENT_OTHER): Payer: Self-pay

## 2022-08-09 DIAGNOSIS — R52 Pain, unspecified: Secondary | ICD-10-CM | POA: Insufficient documentation

## 2022-10-19 ENCOUNTER — Other Ambulatory Visit (HOSPITAL_COMMUNITY): Payer: Self-pay

## 2022-10-19 DIAGNOSIS — M545 Low back pain, unspecified: Secondary | ICD-10-CM

## 2022-10-21 IMAGING — MR ARTHOGRAM MRI LT HIP
7 series · 40 of 40 positions shown · IV contrast (multihance)
Comparison: none

﻿Pertinent Hx:    Back pain and left hip pain.  Date of injury 06/14/2022.
TECHNIQUE: Using sterile technique and local anesthesia and with fluoroscopic guidance, a 20-gauge needle was placed into the hip.  Approximately 7-8 cc of a dilute mixture of MultiHance mixed in normal saline were injected into the hip along with a small amount of [id number].  Fluoro time:  min.  

T1-weighted and proton density axial, T1-weighted and proton density coronal, and proton density sagittal images of the hip were taken.  T2-weighted coronal images were also obtained.

[Series 3: STIR · coronal · 4.0mm · 1.25mm/px · 7 of 30 slices shown]
[im 1/30]
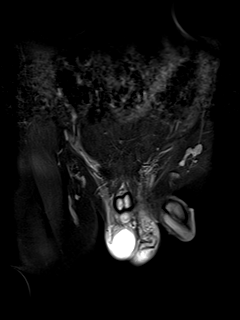
[im 5/30]
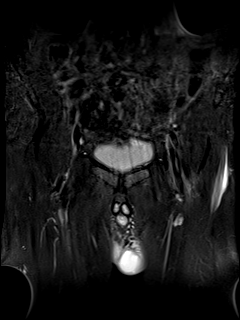
[im 10/30]
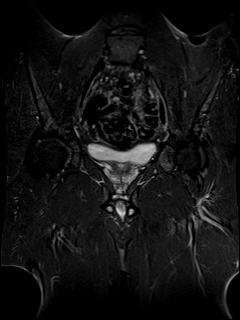
[im 15/30]
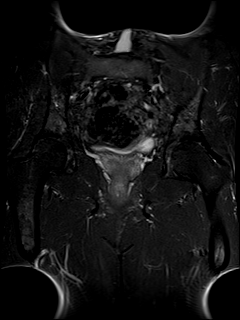
[im 20/30]
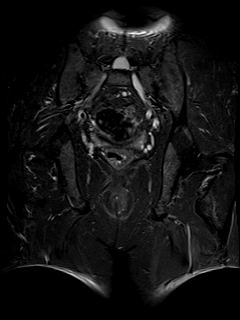
[im 25/30]
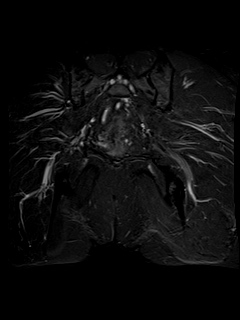
[im 30/30]
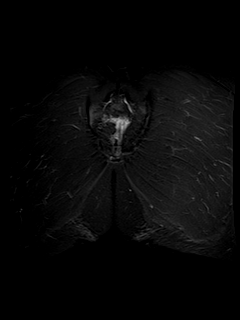

[Series 4: T1 · axial · 4.0mm · 0.86mm/px · z∈[+11,+121]mm · 6 of 28 slices shown (1 of 2)]
[im 1/28]
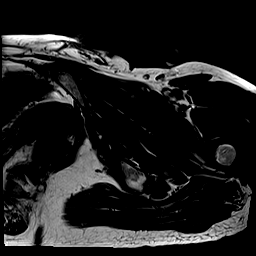
[im 6/28]
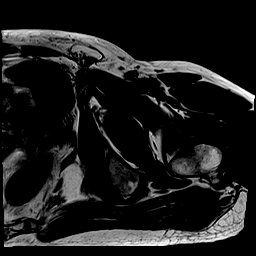
[im 11/28]
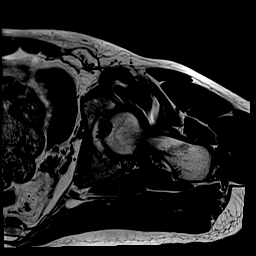
[im 17/28]
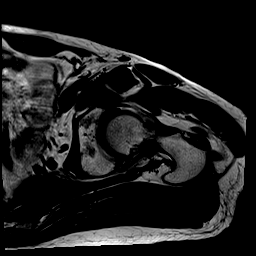
[im 22/28]
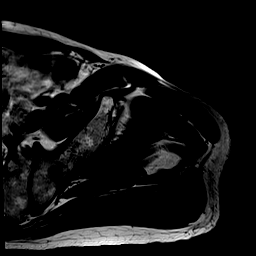
[im 28/28]
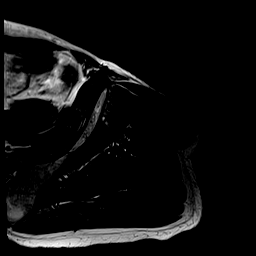

[Series 5: PD fat-sat · axial · 4.0mm · 0.43mm/px · z∈[+11,+121]mm · 6 of 28 slices shown (1 of 3)]
[im 1/28]
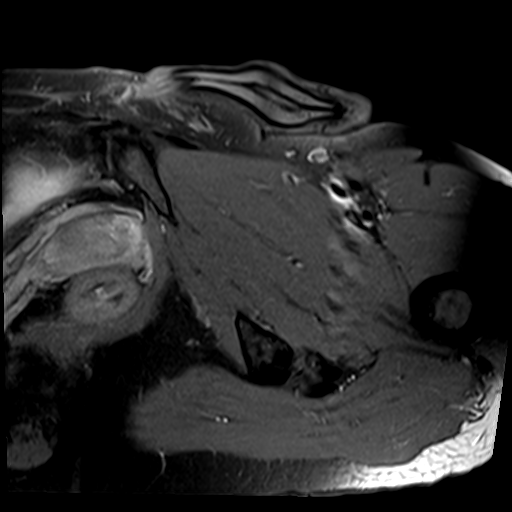
[im 6/28]
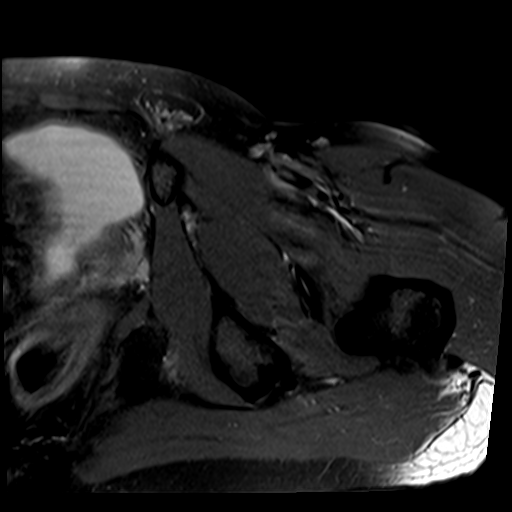
[im 11/28]
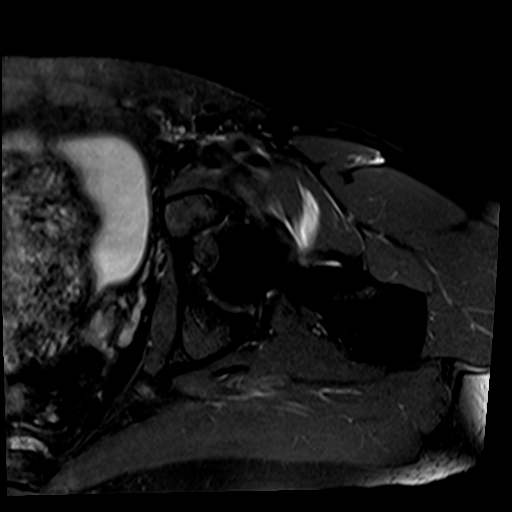
[im 17/28]
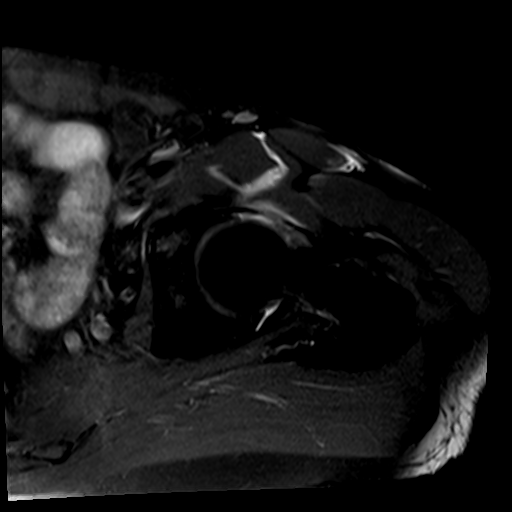
[im 22/28]
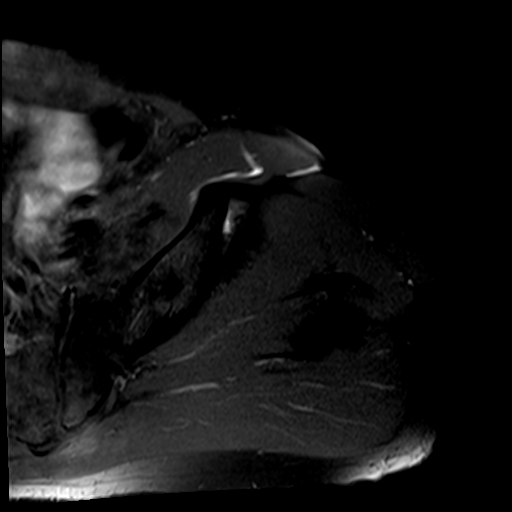
[im 28/28]
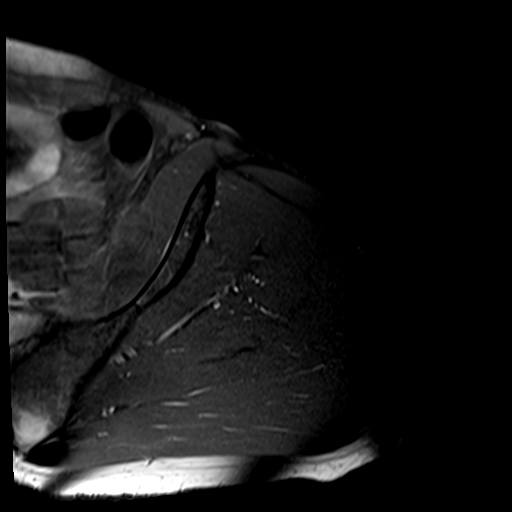

[Series 6: T1 · coronal · 4.0mm · 0.70mm/px · 5 of 20 slices shown (2 of 2)]
[im 1/20]
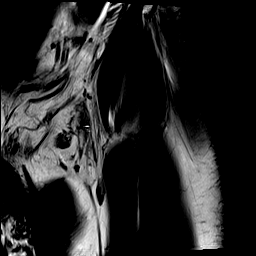
[im 5/20]
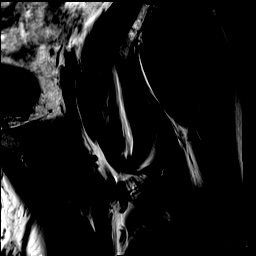
[im 10/20]
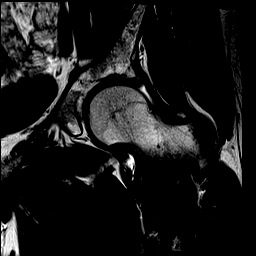
[im 15/20]
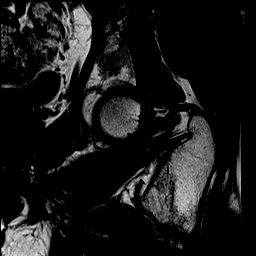
[im 20/20]
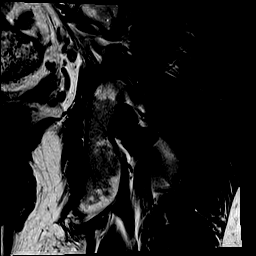

[Series 7: PD fat-sat · coronal · 4.0mm · 0.56mm/px · 5 of 20 slices shown (2 of 3)]
[im 1/20]
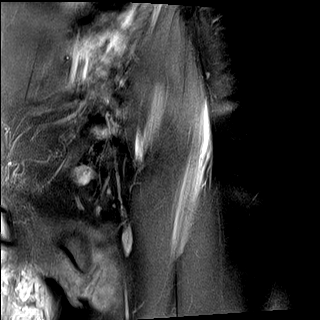
[im 5/20]
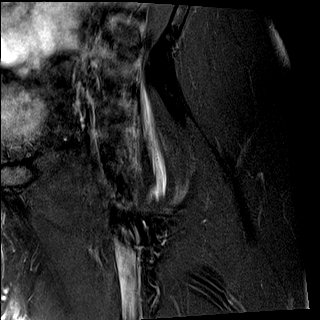
[im 10/20]
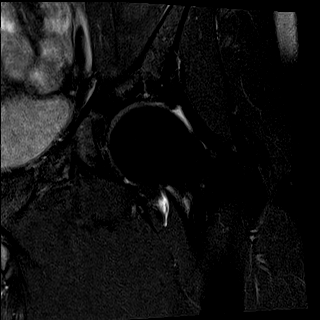
[im 15/20]
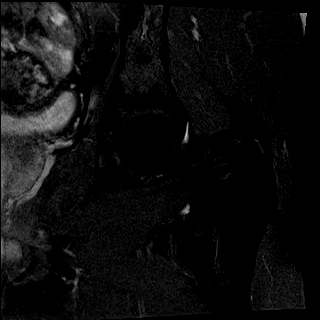
[im 20/20]
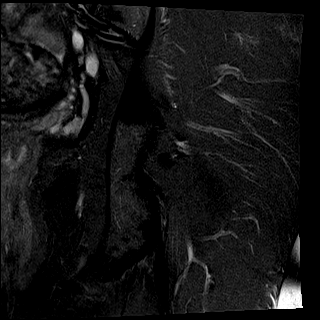

[Series 8: T2 · coronal · 4.0mm · 0.70mm/px · 5 of 20 slices shown]
[im 1/20]
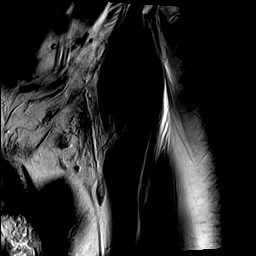
[im 5/20]
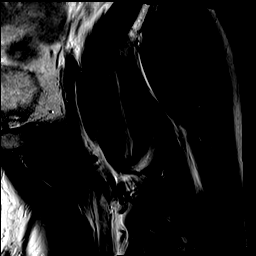
[im 10/20]
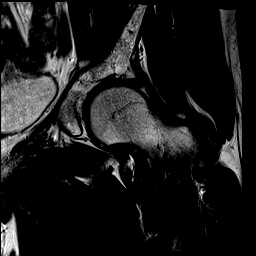
[im 15/20]
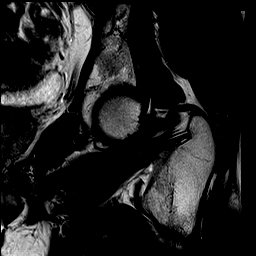
[im 20/20]
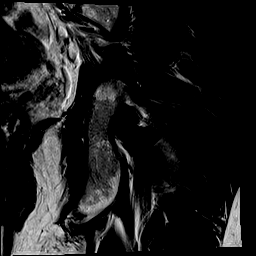

[Series 9: PD fat-sat · sagittal · 4.0mm · 0.86mm/px · 6 of 28 slices shown (3 of 3)]
[im 1/28]
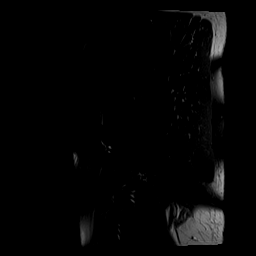
[im 6/28]
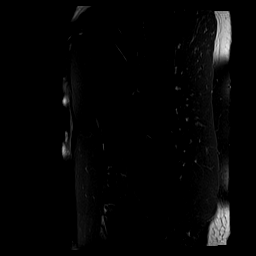
[im 11/28]
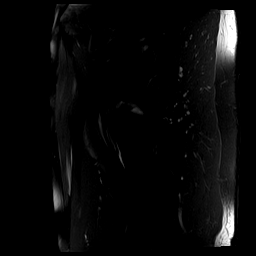
[im 17/28]
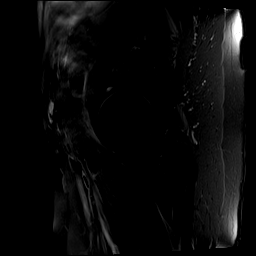
[im 22/28]
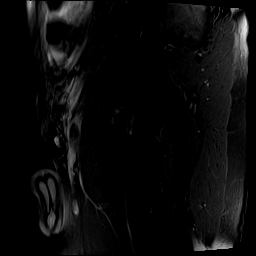
[im 28/28]
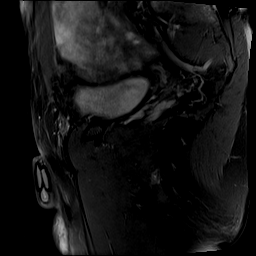

[40 of 40 positions shown; findings below may reference images not displayed]

FINDINGS: Bony morphology appears normal.  

Bone marrow signal is normal.  

No tear of the acetabular labrum can be identified.  

No muscle or tendon tear around the hip can be identified.  No soft tissue abnormality identified.  

No intrapelvic abnormality.
IMPRESSION: Negative MRI arthrogram of the left hip.  No abnormality identified.

## 2022-10-21 IMAGING — MR MRI LUMBAR SPINE WITHOUT CONTRAST
5 series · 48 of 48 positions shown · non-contrast
Comparison: none

﻿Pertinent Hx:    Back pain and left hip pain.  Date of injury 06/14/2022.
TECHNIQUE: T1-weighted, T2-weighted, and inversion recovery sagittal images of the lumbar spine were taken.  T1 and T2-weighted axial images were also obtained.

[Series 2: T2 · sagittal · 3.5mm · 0.81mm/px · 6 of 15 slices shown (1 of 2)]
[im 1/15]
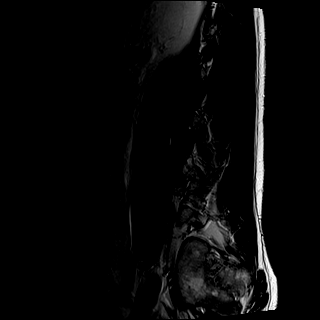
[im 3/15]
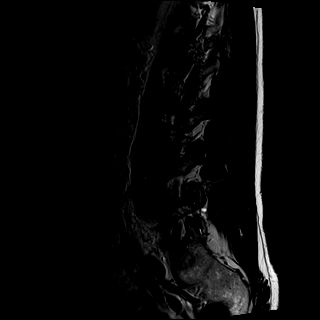
[im 6/15]
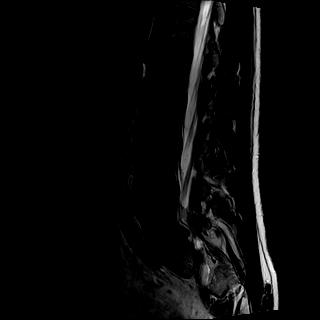
[im 9/15]
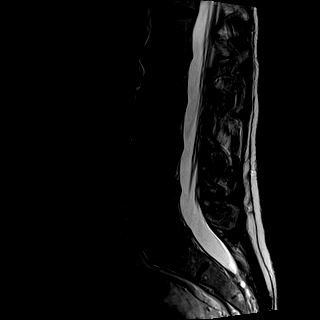
[im 12/15]
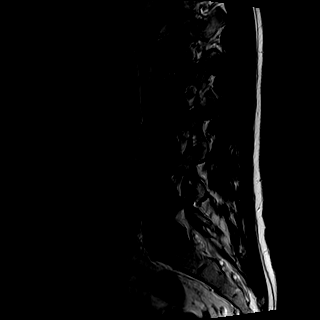
[im 15/15]
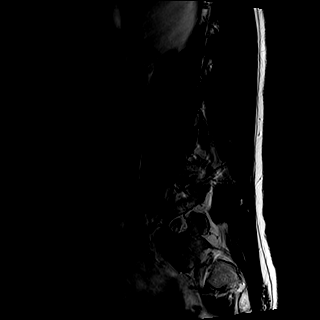

[Series 3: T1 · sagittal · 3.5mm · 0.81mm/px · 7 of 15 slices shown (1 of 2)]
[im 1/15]
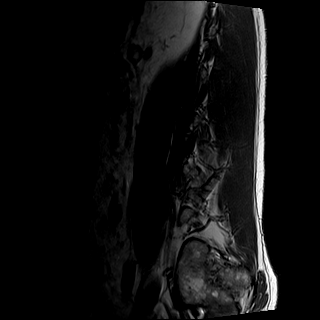
[im 3/15]
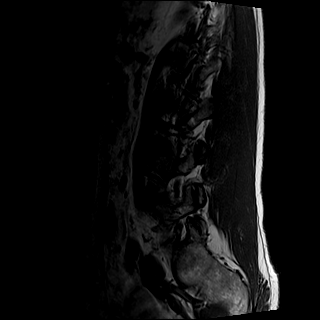
[im 5/15]
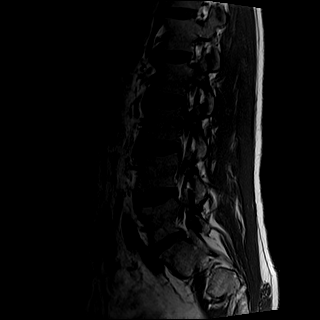
[im 8/15]
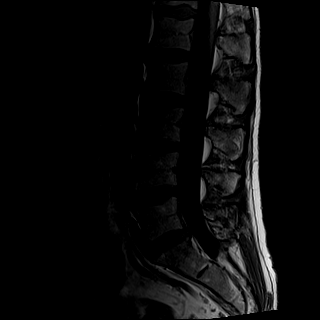
[im 10/15]
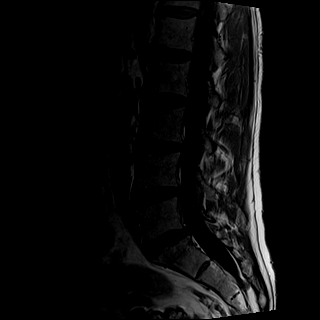
[im 12/15]
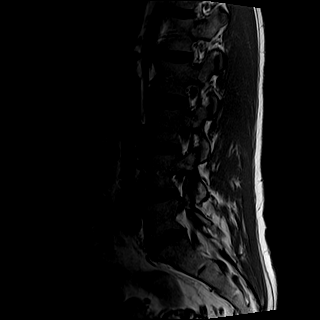
[im 15/15]
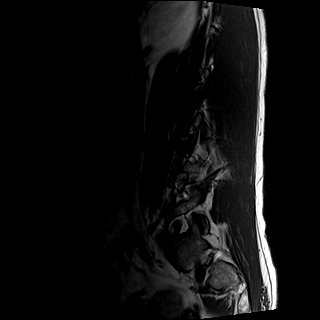

[Series 4: T2 · axial · 4.0mm · 0.70mm/px · z∈[-144,+76]mm · 14 of 32 slices shown (2 of 2)]
[im 1/32]
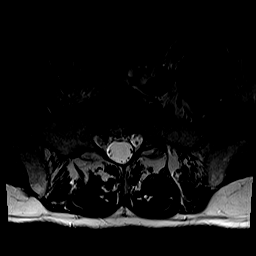
[im 3/32]
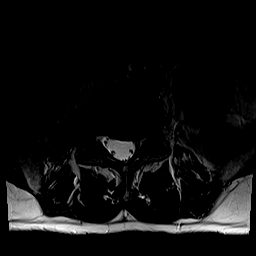
[im 5/32]
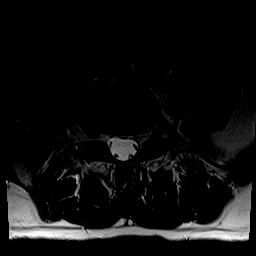
[im 8/32]
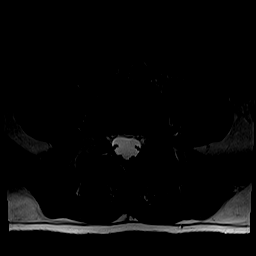
[im 10/32]
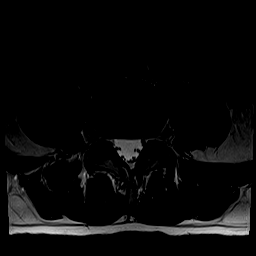
[im 12/32]
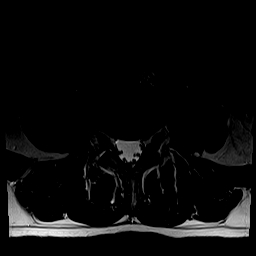
[im 15/32]
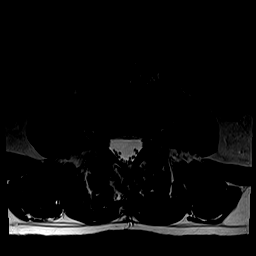
[im 17/32]
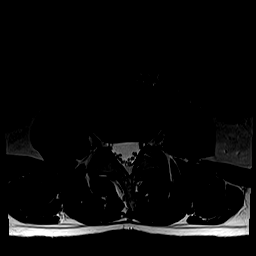
[im 20/32]
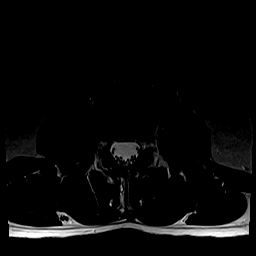
[im 22/32]
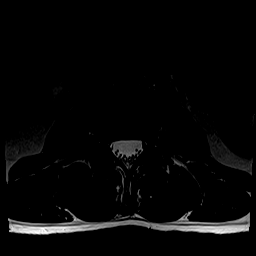
[im 24/32]
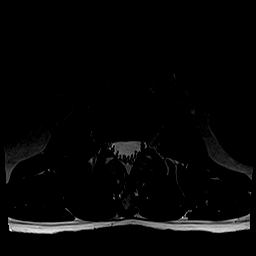
[im 27/32]
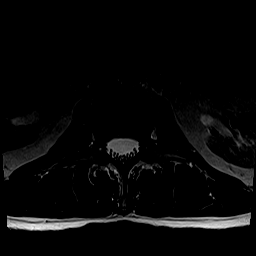
[im 29/32]
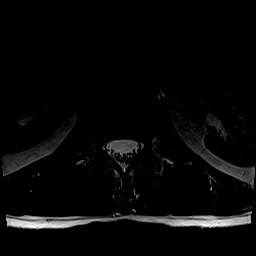
[im 32/32]
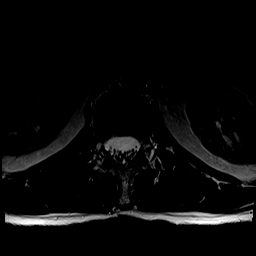

[Series 5: T1 · axial · 4.0mm · 0.70mm/px · z∈[-144,+76]mm · 14 of 32 slices shown (2 of 2)]
[im 1/32]
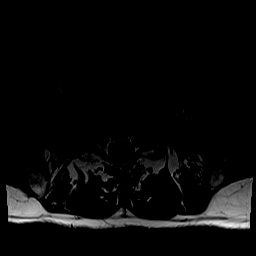
[im 3/32]
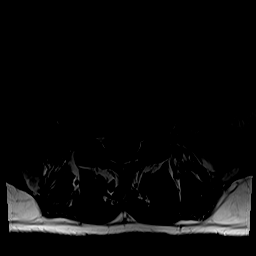
[im 5/32]
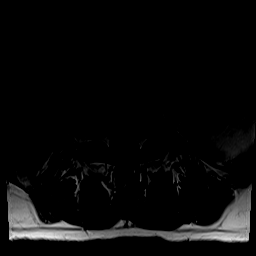
[im 8/32]
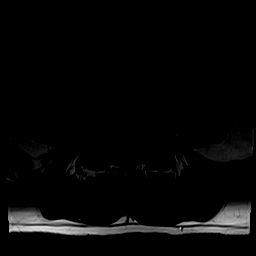
[im 10/32]
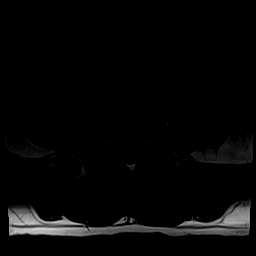
[im 12/32]
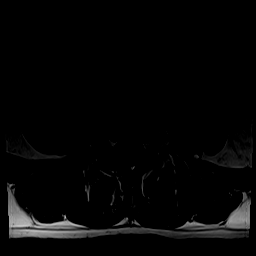
[im 15/32]
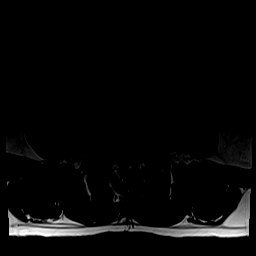
[im 17/32]
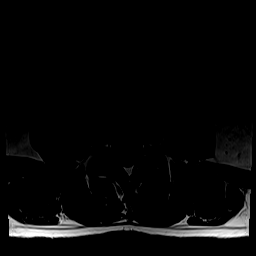
[im 20/32]
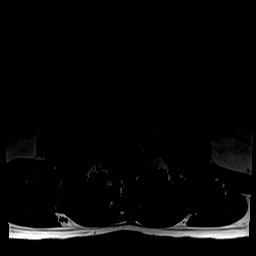
[im 22/32]
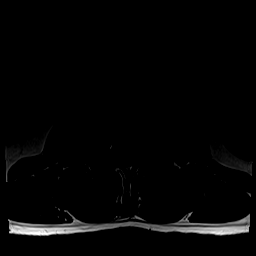
[im 24/32]
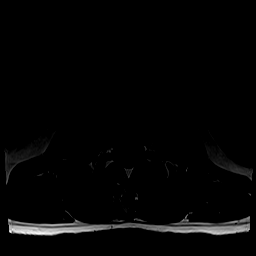
[im 27/32]
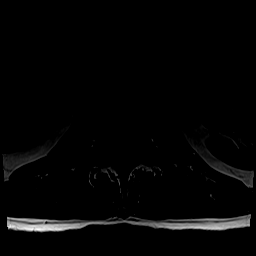
[im 29/32]
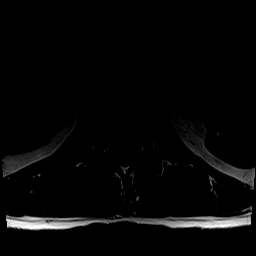
[im 32/32]
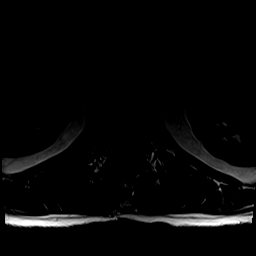

[Series 6: STIR · sagittal · 3.5mm · 1.02mm/px · 7 of 15 slices shown]
[im 1/15]
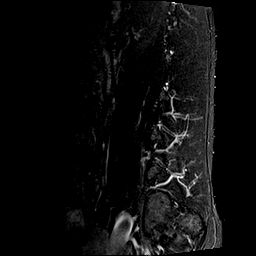
[im 3/15]
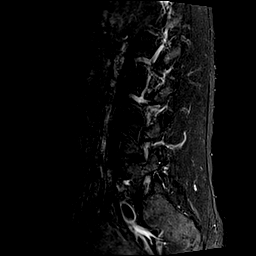
[im 5/15]
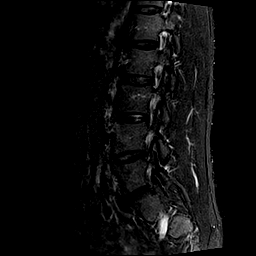
[im 8/15]
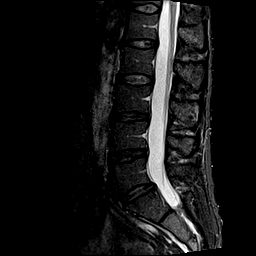
[im 10/15]
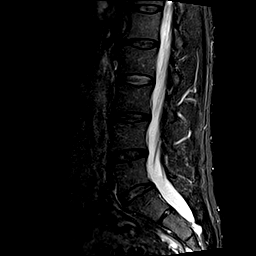
[im 12/15]
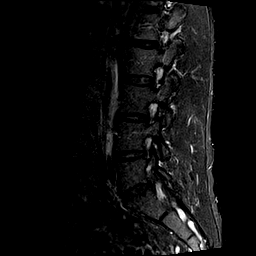
[im 15/15]
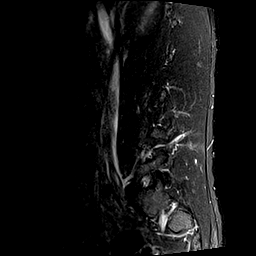

[48 of 48 positions shown; findings below may reference images not displayed]

FINDINGS: Alignment is normal.  

At L5-S1 there is left posterolateral disc protrusion.  There is contact with the left S1 nerve root.  

Other lumbar discs appear normal.  No other disc protrusion or extrusion identified.  

There is no evidence of central canal spinal stenosis.  

No evidence of foraminal stenosis.  

The conus medullaris is normal.  

Paraspinal muscles appear normal.  

No abnormality identified in the abdomen.
IMPRESSION: 1. Disc herniation.  There is left posterolateral disc protrusion at L5-S1.  There is impingement on the left S1 nerve root.  

2. No other abnormality identified.

## 2022-10-22 ENCOUNTER — Other Ambulatory Visit (HOSPITAL_COMMUNITY): Payer: Self-pay

## 2022-10-22 DIAGNOSIS — M25552 Pain in left hip: Secondary | ICD-10-CM

## 2022-12-20 ENCOUNTER — Ambulatory Visit (HOSPITAL_COMMUNITY): Payer: Self-pay

## 2023-08-09 ENCOUNTER — Encounter (INDEPENDENT_AMBULATORY_CARE_PROVIDER_SITE_OTHER): Payer: Self-pay

## 2023-10-28 ENCOUNTER — Other Ambulatory Visit: Payer: Self-pay

## 2023-10-28 ENCOUNTER — Ambulatory Visit
Admission: RE | Admit: 2023-10-28 | Discharge: 2023-10-28 | Disposition: A | Discharge status: Home / Self Care | Source: Ambulatory Visit | Attending: Adult Reconstructive Orthopaedic Surgery | Admitting: Adult Reconstructive Orthopaedic Surgery

## 2023-10-28 ENCOUNTER — Other Ambulatory Visit (HOSPITAL_COMMUNITY): Payer: Self-pay | Admitting: Adult Reconstructive Orthopaedic Surgery

## 2023-10-28 DIAGNOSIS — R52 Pain, unspecified: Secondary | ICD-10-CM
